# Patient Record
Sex: Female | Born: 1935 | Race: Black or African American | Hispanic: No | Smoking: Never smoker
Health system: Southern US, Community
[De-identification: ages and names within clinical notes are randomized; demographics above are authoritative.]

## PROBLEM LIST (undated history)

## (undated) DIAGNOSIS — E669 Obesity, unspecified: Secondary | ICD-10-CM

## (undated) DIAGNOSIS — M48061 Spinal stenosis, lumbar region without neurogenic claudication: Secondary | ICD-10-CM

## (undated) DIAGNOSIS — E119 Type 2 diabetes mellitus without complications: Secondary | ICD-10-CM

## (undated) DIAGNOSIS — I10 Essential (primary) hypertension: Secondary | ICD-10-CM

## (undated) DIAGNOSIS — E7800 Pure hypercholesterolemia, unspecified: Secondary | ICD-10-CM

## (undated) DIAGNOSIS — M79606 Pain in leg, unspecified: Secondary | ICD-10-CM

## (undated) HISTORY — DX: Pain in leg, unspecified: M79.606

## (undated) HISTORY — DX: Pure hypercholesterolemia, unspecified: E78.00

## (undated) HISTORY — DX: Obesity, unspecified: E66.9

## (undated) HISTORY — DX: Essential (primary) hypertension: I10

## (undated) HISTORY — DX: Type 2 diabetes mellitus without complications: E11.9

## (undated) HISTORY — PX: COLONOSCOPY: SHX174

## (undated) HISTORY — DX: Spinal stenosis, lumbar region without neurogenic claudication: M48.061

---

## 1998-08-04 ENCOUNTER — Ambulatory Visit (HOSPITAL_COMMUNITY): Admission: RE | Admit: 1998-08-04 | Discharge: 1998-08-04 | Payer: Self-pay | Admitting: Internal Medicine

## 1998-08-04 ENCOUNTER — Encounter: Payer: Self-pay | Admitting: Internal Medicine

## 1998-09-03 ENCOUNTER — Ambulatory Visit (HOSPITAL_COMMUNITY): Admission: RE | Admit: 1998-09-03 | Discharge: 1998-09-03 | Payer: Self-pay | Admitting: Ophthalmology

## 1998-09-03 ENCOUNTER — Encounter: Payer: Self-pay | Admitting: Ophthalmology

## 1998-09-21 ENCOUNTER — Other Ambulatory Visit: Admission: RE | Admit: 1998-09-21 | Discharge: 1998-09-21 | Payer: Self-pay | Admitting: Internal Medicine

## 1999-08-24 ENCOUNTER — Encounter: Payer: Self-pay | Admitting: Internal Medicine

## 1999-08-24 ENCOUNTER — Ambulatory Visit (HOSPITAL_COMMUNITY): Admission: RE | Admit: 1999-08-24 | Discharge: 1999-08-24 | Payer: Self-pay | Admitting: Internal Medicine

## 2000-09-12 ENCOUNTER — Encounter: Payer: Self-pay | Admitting: Internal Medicine

## 2000-09-12 ENCOUNTER — Ambulatory Visit (HOSPITAL_COMMUNITY): Admission: RE | Admit: 2000-09-12 | Discharge: 2000-09-12 | Payer: Self-pay | Admitting: Internal Medicine

## 2001-09-18 ENCOUNTER — Encounter: Payer: Self-pay | Admitting: Internal Medicine

## 2001-09-18 ENCOUNTER — Ambulatory Visit (HOSPITAL_COMMUNITY): Admission: RE | Admit: 2001-09-18 | Discharge: 2001-09-18 | Payer: Self-pay | Admitting: Internal Medicine

## 2002-02-04 ENCOUNTER — Encounter: Payer: Self-pay | Admitting: Internal Medicine

## 2002-02-04 ENCOUNTER — Encounter: Admission: RE | Admit: 2002-02-04 | Discharge: 2002-02-04 | Payer: Self-pay | Admitting: Internal Medicine

## 2002-09-20 ENCOUNTER — Ambulatory Visit (HOSPITAL_COMMUNITY): Admission: RE | Admit: 2002-09-20 | Discharge: 2002-09-20 | Payer: Self-pay | Admitting: Internal Medicine

## 2002-09-20 ENCOUNTER — Encounter: Payer: Self-pay | Admitting: Internal Medicine

## 2002-10-06 ENCOUNTER — Emergency Department (HOSPITAL_COMMUNITY): Admission: EM | Admit: 2002-10-06 | Discharge: 2002-10-06 | Payer: Self-pay | Admitting: Emergency Medicine

## 2003-10-29 ENCOUNTER — Ambulatory Visit (HOSPITAL_COMMUNITY): Admission: RE | Admit: 2003-10-29 | Discharge: 2003-10-29 | Payer: Self-pay | Admitting: Internal Medicine

## 2004-03-11 ENCOUNTER — Other Ambulatory Visit: Admission: RE | Admit: 2004-03-11 | Discharge: 2004-03-11 | Payer: Self-pay | Admitting: Family Medicine

## 2004-03-17 ENCOUNTER — Encounter: Admission: RE | Admit: 2004-03-17 | Discharge: 2004-03-17 | Payer: Self-pay | Admitting: Internal Medicine

## 2004-08-04 ENCOUNTER — Encounter: Admission: RE | Admit: 2004-08-04 | Discharge: 2004-08-04 | Payer: Self-pay | Admitting: Internal Medicine

## 2004-10-18 ENCOUNTER — Ambulatory Visit: Payer: Self-pay | Admitting: Internal Medicine

## 2004-11-01 ENCOUNTER — Ambulatory Visit: Payer: Self-pay | Admitting: Internal Medicine

## 2005-09-21 ENCOUNTER — Emergency Department (HOSPITAL_COMMUNITY): Admission: EM | Admit: 2005-09-21 | Discharge: 2005-09-21 | Payer: Self-pay | Admitting: Emergency Medicine

## 2007-01-12 ENCOUNTER — Ambulatory Visit: Admission: RE | Admit: 2007-01-12 | Discharge: 2007-01-12 | Payer: Self-pay | Admitting: Internal Medicine

## 2007-01-17 ENCOUNTER — Ambulatory Visit: Payer: Self-pay | Admitting: Pulmonary Disease

## 2009-02-26 ENCOUNTER — Encounter: Admission: RE | Admit: 2009-02-26 | Discharge: 2009-02-26 | Payer: Self-pay | Admitting: Internal Medicine

## 2009-10-07 ENCOUNTER — Encounter (INDEPENDENT_AMBULATORY_CARE_PROVIDER_SITE_OTHER): Payer: Self-pay | Admitting: *Deleted

## 2010-05-13 ENCOUNTER — Encounter: Admission: RE | Admit: 2010-05-13 | Discharge: 2010-05-13 | Payer: Self-pay | Admitting: Internal Medicine

## 2010-07-29 NOTE — Letter (Signed)
Summary: Colonoscopy Date Change Letter  [facility]  [address] [place], [place] [address]   Phone: [phone]  Fax: [phone]      October 07, 2009 MRN: [id number]   Brandi Stokes [address] [place], [place]  [address]   Dear [name],   Previously you were recommended to have a repeat colonoscopy around this time. Your chart was recently reviewed by Dr. Hedwig Morton. Juanda Chance of [facility]. Follow up colonoscopy is now recommended in May 2013. This revised recommendation is based on current, nationally recognized guidelines for colorectal cancer screening and polyp surveillance. These guidelines are endorsed by the American Cancer Society, [employer] on Colorectal Cancer as well as numerous other major medical organizations.  Please understand that our recommendation assumes that you do not have any new symptoms such as bleeding, a change in bowel habits, anemia, or significant abdominal discomfort. If you do have any concerning GI symptoms or want to discuss the guideline recommendations, please call to arrange an office visit at your earliest convenience. Otherwise we will keep you in our reminder system and contact you 1-2 months prior to the date listed above to schedule your next colonoscopy.  Thank you,  Hedwig Morton. Juanda Chance, M.D.  [facility] [phone]

## 2010-10-08 ENCOUNTER — Encounter: Payer: Self-pay | Admitting: Internal Medicine

## 2010-11-09 NOTE — Procedures (Signed)
Brandi Stokes, Brandi Stokes              ACCOUNT NO.:  [id number]   MEDICAL RECORD NO.:  [id number]          PATIENT TYPE:  OUT   LOCATION:  SLEEP LAB                     FACILITY:  [facility]   PHYSICIAN:  Barbaraann Share, MD,FCCPDATE OF BIRTH:  10-22-33   DATE OF STUDY:  01/12/2007                            NOCTURNAL POLYSOMNOGRAM   REFERRING PHYSICIAN:  Minerva Areola L. August Saucer, M.D.   INDICATIONS:  Hypersomnia with sleep apnea.   EPWORTH SLEEPINESS SCORE:  11.   MEDICATIONS:   SLEEP ARCHITECTURE:  The patient had A total sleep time of 355 minutes  with very little slow wave sleep or REM.  Sleep onset latency was mildly  prolonged at 34 minutes and REM onset was very prolonged at 154 minutes.  Sleep efficiency was decreased at 78%.   RESPIRATORY DATA:  The patient was found to have 38 hypopneas and 8  obstructive apneas for an apnea/hypopnea index of 8 events per hour.  These events were much more severe during REM and there was loud snoring  noted throughout.  The patient did not meet split night criteria  secondary to the majority of her events occurring after 1:00 a.m.   OXYGEN DATA:  The patient had O2 desaturation as low as 69% with her  events during REM.   CARDIAC DATA:  There was no clinically significant cardiac arrhythmias  noted.   MOVEMENT-PARASOMNIA:  The patient was found to have 24 leg jerks with  1.2 per hour resulting in arousal or awakening.  This is clinically  insignificant.   IMPRESSION/RECOMMENDATIONS:  Mild obstructive sleep apnea/hypopnea  syndrome with an apnea/hypopnea index of 8 events per hour and O2  desaturation as low as 69%.  The events were clearly worse during REM.  The patient did not meet split night criteria secondary to most of her  events occurring after 1:00 a.m.  Treatment for this degree of sleep  apnea can include weight loss alone if applicable, upper airway surgery,  oral  appliance, and also CPAP.  Decisions regarding treatment for this mild  degree of sleep apnea should primarily be based on its impact on the  patient's quality of life.      Barbaraann Share, MD,FCCP  Diplomate, American Board of Sleep  Medicine  Electronically Signed     [facility]/MEDQ  D:  01/17/2007 18:04:47  T:  01/18/2007 09:32:04  Job:  [id number]

## 2010-11-26 ENCOUNTER — Emergency Department (HOSPITAL_COMMUNITY)
Admission: EM | Admit: 2010-11-26 | Discharge: 2010-11-27 | Disposition: A | Discharge status: Home / Self Care | Payer: Medicare Other | Attending: Emergency Medicine | Admitting: Emergency Medicine

## 2010-11-26 ENCOUNTER — Emergency Department (HOSPITAL_COMMUNITY): Payer: Medicare Other

## 2010-11-26 DIAGNOSIS — M5430 Sciatica, unspecified side: Secondary | ICD-10-CM | POA: Insufficient documentation

## 2010-11-26 DIAGNOSIS — Z79899 Other long term (current) drug therapy: Secondary | ICD-10-CM | POA: Insufficient documentation

## 2010-11-26 DIAGNOSIS — E119 Type 2 diabetes mellitus without complications: Secondary | ICD-10-CM | POA: Insufficient documentation

## 2010-11-26 DIAGNOSIS — E785 Hyperlipidemia, unspecified: Secondary | ICD-10-CM | POA: Insufficient documentation

## 2010-11-26 DIAGNOSIS — I10 Essential (primary) hypertension: Secondary | ICD-10-CM | POA: Insufficient documentation

## 2010-12-14 ENCOUNTER — Ambulatory Visit (AMBULATORY_SURGERY_CENTER): Payer: Medicare Other

## 2010-12-14 VITALS — Ht 64.0 in | Wt 214.3 lb

## 2010-12-14 DIAGNOSIS — Z86010 Personal history of colonic polyps: Secondary | ICD-10-CM

## 2010-12-14 MED ORDER — PEG-KCL-NACL-NASULF-NA ASC-C 100 G PO SOLR: 1.0000 | Freq: Once | ORAL | Status: AC

## 2010-12-28 ENCOUNTER — Encounter: Payer: Self-pay | Admitting: Internal Medicine

## 2010-12-28 ENCOUNTER — Ambulatory Visit (AMBULATORY_SURGERY_CENTER): Payer: Medicare Other | Admitting: Internal Medicine

## 2010-12-28 VITALS — BP 120/70 | HR 61 | Temp 98.5°F | Resp 17 | Ht 64.0 in | Wt 214.0 lb

## 2010-12-28 DIAGNOSIS — Z86010 Personal history of colonic polyps: Secondary | ICD-10-CM

## 2010-12-28 DIAGNOSIS — D126 Benign neoplasm of colon, unspecified: Secondary | ICD-10-CM

## 2010-12-28 DIAGNOSIS — Z1211 Encounter for screening for malignant neoplasm of colon: Secondary | ICD-10-CM

## 2010-12-28 DIAGNOSIS — K5730 Diverticulosis of large intestine without perforation or abscess without bleeding: Secondary | ICD-10-CM

## 2010-12-28 LAB — GLUCOSE, CAPILLARY: Glucose-Capillary: 130 mg/dL — ABNORMAL HIGH (ref 70–99)

## 2010-12-28 MED ORDER — SODIUM CHLORIDE 0.9 % IV SOLN: 500.0000 mL | INTRAVENOUS | Status: AC

## 2010-12-28 NOTE — Patient Instructions (Signed)
Follow discharge instructions.  Continue your medications.  Await pathology results. 

## 2010-12-28 NOTE — Progress Notes (Signed)
IV attempted to Right forearm with #24- blood easily returned but IV site immediately puffs up. Attempted to Left hand with #24- same as above.  Burnice Logan RN

## 2010-12-30 ENCOUNTER — Telehealth: Payer: Self-pay | Admitting: *Deleted

## 2010-12-30 NOTE — Telephone Encounter (Signed)
No answer or identifier on answering machine. No message left for patient.

## 2011-01-06 ENCOUNTER — Encounter: Payer: Self-pay | Admitting: Internal Medicine

## 2011-06-15 ENCOUNTER — Other Ambulatory Visit: Payer: Self-pay | Admitting: Internal Medicine

## 2011-06-15 DIAGNOSIS — Z1231 Encounter for screening mammogram for malignant neoplasm of breast: Secondary | ICD-10-CM

## 2011-07-12 ENCOUNTER — Ambulatory Visit
Admission: RE | Admit: 2011-07-12 | Discharge: 2011-07-12 | Disposition: A | Discharge status: Home / Self Care | Payer: Medicare Other | Source: Ambulatory Visit | Attending: Internal Medicine | Admitting: Internal Medicine

## 2011-07-12 DIAGNOSIS — Z1231 Encounter for screening mammogram for malignant neoplasm of breast: Secondary | ICD-10-CM

## 2011-07-12 LAB — HM MAMMOGRAPHY

## 2012-07-17 ENCOUNTER — Encounter: Payer: Self-pay | Admitting: Hematology

## 2013-06-03 ENCOUNTER — Other Ambulatory Visit: Payer: Self-pay | Admitting: Internal Medicine

## 2013-06-03 DIAGNOSIS — Z1231 Encounter for screening mammogram for malignant neoplasm of breast: Secondary | ICD-10-CM

## 2013-07-09 ENCOUNTER — Ambulatory Visit
Admission: RE | Admit: 2013-07-09 | Discharge: 2013-07-09 | Disposition: A | Discharge status: Home / Self Care | Payer: Medicare Other | Source: Ambulatory Visit | Attending: Internal Medicine | Admitting: Internal Medicine

## 2013-07-09 DIAGNOSIS — Z1231 Encounter for screening mammogram for malignant neoplasm of breast: Secondary | ICD-10-CM

## 2015-06-06 ENCOUNTER — Emergency Department (HOSPITAL_COMMUNITY)
Admission: EM | Admit: 2015-06-06 | Discharge: 2015-06-06 | Disposition: A | Discharge status: Home / Self Care | Payer: Medicare Other | Attending: Emergency Medicine | Admitting: Emergency Medicine

## 2015-06-06 ENCOUNTER — Encounter (HOSPITAL_COMMUNITY): Payer: Self-pay

## 2015-06-06 DIAGNOSIS — D849 Immunodeficiency, unspecified: Secondary | ICD-10-CM | POA: Diagnosis not present

## 2015-06-06 DIAGNOSIS — T464X5A Adverse effect of angiotensin-converting-enzyme inhibitors, initial encounter: Secondary | ICD-10-CM | POA: Diagnosis present

## 2015-06-06 DIAGNOSIS — Z79899 Other long term (current) drug therapy: Secondary | ICD-10-CM | POA: Insufficient documentation

## 2015-06-06 DIAGNOSIS — E669 Obesity, unspecified: Secondary | ICD-10-CM | POA: Insufficient documentation

## 2015-06-06 DIAGNOSIS — E7800 Pure hypercholesterolemia, unspecified: Secondary | ICD-10-CM | POA: Insufficient documentation

## 2015-06-06 DIAGNOSIS — I10 Essential (primary) hypertension: Secondary | ICD-10-CM | POA: Diagnosis not present

## 2015-06-06 DIAGNOSIS — T783XXA Angioneurotic edema, initial encounter: Secondary | ICD-10-CM | POA: Diagnosis not present

## 2015-06-06 DIAGNOSIS — E119 Type 2 diabetes mellitus without complications: Secondary | ICD-10-CM | POA: Diagnosis not present

## 2015-06-06 DIAGNOSIS — Z7982 Long term (current) use of aspirin: Secondary | ICD-10-CM | POA: Insufficient documentation

## 2015-06-06 DIAGNOSIS — Z8739 Personal history of other diseases of the musculoskeletal system and connective tissue: Secondary | ICD-10-CM | POA: Insufficient documentation

## 2015-06-06 NOTE — ED Notes (Signed)
Pt presents with c/o allergic reaction. Pt reports she is unsure of what she is having a reaction to. Pt has some swelling around her mouth. Pt reports some itching on her arms as well. Pt is not having any difficulty breathing at this time.

## 2015-06-06 NOTE — Discharge Instructions (Signed)
Angioedema This stopped taking lisinopril-HCTZ. We feel that this is the cause of your lip swelling. Call Dr. Randel Pigg office in 2 days to schedule the next available appointment. We feel that you may need to be on a different blood pressure medication. Today's blood pressure was mildly elevated at 159/69. Return if you feel that swelling is worsening, he developed difficulty breathing, speaking or feel worse for any reason. Angioedema is sudden puffiness (swelling), often of the skin. It can happen:  On your face or privates (genitals).  In your belly (abdomen) or other body parts. It usually happens quickly and gets better in 1 or 2 days. It often starts at night and is found when you wake up. You may get red, itchy patches of skin (hives). Attacks can be dangerous if your breathing passages get puffy. The condition may happen only once, or it can come back at random times. It may happen for several years before it goes away for good. HOME CARE  Only take medicines as told by your doctor.  Always carry your emergency allergy medicines with you.  Wear a medical bracelet as told by your doctor.  Avoid things that you know will cause attacks (triggers). GET HELP IF:  You have another attack.  Your attacks happen more often or get worse.  The condition was passed to you by your parents and you want to have children. GET HELP RIGHT AWAY IF:   Your mouth, tongue, or lips are very puffy.  You have trouble breathing.  You have trouble swallowing.  You pass out (faint). MAKE SURE YOU:   Understand these instructions.  Will watch your condition.  Will get help right away if you are not doing well or get worse.   This information is not intended to replace advice given to you by your health care provider. Make sure you discuss any questions you have with your health care provider.   Document Released: 06/01/2009 Document Revised: 04/03/2013 Document Reviewed: 02/04/2013 Elsevier  Interactive Patient Education [employer].

## 2015-06-06 NOTE — ED Provider Notes (Signed)
CSN: [id number]     Arrival date & time 06/06/15  1257 History   First MD Initiated Contact with Patient 06/06/15 1321     Chief Complaint  Patient presents with  . Allergic Reaction     (Consider location/radiation/quality/duration/timing/severity/associated sxs/prior Treatment) HPI Patient noted swelling of her upper lip at 10:30 AM today. No other complaint. Denies difficulty breathing or speaking. No change in voice. She recently started lisinopril within the past week after having stopped it for several months. No treatment prior to coming here. No other associated symptoms. She denies any itching. Nothing makes symptoms better or worse. Past Medical History  Diagnosis Date  . Type II or unspecified type diabetes mellitus without mention of complication, not stated as uncontrolled   . Pure hypercholesterolemia   . Essential hypertension, malignant   . Obesity, unspecified   . Spinal stenosis, lumbar region, without neurogenic claudication   . Leg pain     left   Past Surgical History  Procedure Laterality Date  . Colonoscopy     Family History  Problem Relation Age of Onset  . Heart disease Mother   . Heart disease Father   . Breast cancer Sister    Social History  Substance Use Topics  . Smoking status: Never Smoker   . Smokeless tobacco: Never Used  . Alcohol Use: No   OB History    No data available     Review of Systems  Constitutional: Negative.   HENT: Negative.   Respiratory: Negative.   Cardiovascular: Negative.   Gastrointestinal: Negative.   Musculoskeletal: Negative.   Skin: Negative.   Allergic/Immunologic: Positive for immunocompromised state.       Diabetic Lip swelling  Neurological: Negative.   Psychiatric/Behavioral: Negative.   All other systems reviewed and are negative.     Allergies  Review of patient's allergies indicates no known allergies.  Home Medications   Prior to Admission medications   Medication Sig Start Date End  Date Taking? Authorizing Provider  aspirin 81 MG tablet Take 81 mg by mouth daily.      Historical Provider, MD  atorvastatin (LIPITOR) 20 MG tablet  12/04/10   Historical Provider, MD  Cholecalciferol (VITAMIN D3) 1000 UNITS CAPS Take 2 capsules by mouth daily.      Historical Provider, MD  HYDROcodone-acetaminophen (VICODIN) 5-500 MG per tablet Take 1 tablet by mouth every 6 (six) hours as needed.      Historical Provider, MD  lisinopril-hydrochlorothiazide (PRINZIDE,ZESTORETIC) 20-25 MG per tablet Take 1 tablet by mouth daily.     Historical Provider, MD  metFORMIN (GLUCOPHAGE) 500 MG tablet Take 500 mg by mouth 2 (two) times daily with a meal.      Historical Provider, MD  Omega-3 Fatty Acids (FISH OIL PO) Take 1,000 mg by mouth daily.      Historical Provider, MD  oxyCODONE-acetaminophen (PERCOCET) 5-325 MG per tablet  11/27/10   Historical Provider, MD   BP 197/77 mmHg  Pulse 71  Temp(Src) 97.4 F (36.3 C) (Oral)  Resp 18  SpO2 97% Physical Exam  Constitutional: She appears well-developed and well-nourished.  HENT:  Head: Normocephalic and atraumatic.  Upper lip mildly swollen no tenderness. No swelling of tongue or uvula. No trismus. Voice is normal no hoarseness  Eyes: Conjunctivae are normal. Pupils are equal, round, and reactive to light.  Neck: Neck supple. No tracheal deviation present. No thyromegaly present.  Cardiovascular: Normal rate and regular rhythm.   No murmur heard. Pulmonary/Chest: Effort normal and  breath sounds normal.  Abdominal: Soft. Bowel sounds are normal. She exhibits no distension. There is no tenderness.  Musculoskeletal: Normal range of motion. She exhibits no edema or tenderness.  Neurological: She is alert. Coordination normal.  Skin: Skin is warm and dry. No rash noted.  Psychiatric: She has a normal mood and affect.  Nursing note and vitals reviewed.   ED Course  Procedures (including critical care time) Labs Review Labs Reviewed - No data to  display  Imaging Review No results found. I have personally reviewed and evaluated these images and lab results as part of my medical decision-making.   EKG Interpretation None      MDM  Clinically patient with angioedema of the upper lip. No signs of airway compromise. Suspect lisinopril as the culprit. Plan stop lisinopril. Follow-up with Dr. Marlou Sa next week to schedule appointment. I've advised her that she may need to be on a different antihypertensive Final diagnoses:  None   diagnoses angioedema      Orlie Dakin, MD 06/06/15 1414

## 2019-06-02 ENCOUNTER — Emergency Department (HOSPITAL_COMMUNITY): Payer: Medicare Other

## 2019-06-02 ENCOUNTER — Emergency Department (HOSPITAL_COMMUNITY)
Admission: EM | Admit: 2019-06-02 | Discharge: 2019-06-02 | Disposition: A | Discharge status: Home / Self Care | Payer: Medicare Other | Attending: Emergency Medicine | Admitting: Emergency Medicine

## 2019-06-02 ENCOUNTER — Other Ambulatory Visit: Payer: Self-pay

## 2019-06-02 ENCOUNTER — Encounter (HOSPITAL_COMMUNITY): Payer: Self-pay | Admitting: Emergency Medicine

## 2019-06-02 DIAGNOSIS — M436 Torticollis: Secondary | ICD-10-CM | POA: Diagnosis not present

## 2019-06-02 DIAGNOSIS — I10 Essential (primary) hypertension: Secondary | ICD-10-CM | POA: Insufficient documentation

## 2019-06-02 DIAGNOSIS — E119 Type 2 diabetes mellitus without complications: Secondary | ICD-10-CM | POA: Insufficient documentation

## 2019-06-02 DIAGNOSIS — R410 Disorientation, unspecified: Secondary | ICD-10-CM | POA: Diagnosis not present

## 2019-06-02 DIAGNOSIS — R519 Headache, unspecified: Secondary | ICD-10-CM | POA: Diagnosis present

## 2019-06-02 LAB — CBC WITH DIFFERENTIAL/PLATELET
Abs Immature Granulocytes: 0.02 10*3/uL (ref 0.00–0.07)
Basophils Absolute: 0 10*3/uL (ref 0.0–0.1)
Basophils Relative: 0 %
Eosinophils Absolute: 0.1 10*3/uL (ref 0.0–0.5)
Eosinophils Relative: 1 %
HCT: 40 % (ref 36.0–46.0)
Hemoglobin: 12.6 g/dL (ref 12.0–15.0)
Immature Granulocytes: 0 %
Lymphocytes Relative: 14 %
Lymphs Abs: 1.1 10*3/uL (ref 0.7–4.0)
MCH: 29.9 pg (ref 26.0–34.0)
MCHC: 31.5 g/dL (ref 30.0–36.0)
MCV: 94.8 fL (ref 80.0–100.0)
Monocytes Absolute: 0.6 10*3/uL (ref 0.1–1.0)
Monocytes Relative: 8 %
Neutro Abs: 5.8 10*3/uL (ref 1.7–7.7)
Neutrophils Relative %: 77 %
Platelets: 284 10*3/uL (ref 150–400)
RBC: 4.22 MIL/uL (ref 3.87–5.11)
RDW: 12.8 % (ref 11.5–15.5)
WBC: 7.7 10*3/uL (ref 4.0–10.5)
nRBC: 0 % (ref 0.0–0.2)

## 2019-06-02 LAB — COMPREHENSIVE METABOLIC PANEL
ALT: 13 U/L (ref 0–44)
AST: 13 U/L — ABNORMAL LOW (ref 15–41)
Albumin: 4.5 g/dL (ref 3.5–5.0)
Alkaline Phosphatase: 79 U/L (ref 38–126)
Anion gap: 12 (ref 5–15)
BUN: 16 mg/dL (ref 8–23)
CO2: 26 mmol/L (ref 22–32)
Calcium: 9.3 mg/dL (ref 8.9–10.3)
Chloride: 104 mmol/L (ref 98–111)
Creatinine, Ser: 1.02 mg/dL — ABNORMAL HIGH (ref 0.44–1.00)
GFR calc Af Amer: 58 mL/min — ABNORMAL LOW (ref >60)
GFR calc non Af Amer: 50 mL/min — ABNORMAL LOW (ref >60)
Glucose, Bld: 108 mg/dL — ABNORMAL HIGH (ref 70–99)
Potassium: 3.5 mmol/L (ref 3.5–5.1)
Sodium: 142 mmol/L (ref 135–145)
Total Bilirubin: 0.9 mg/dL (ref 0.3–1.2)
Total Protein: 7.9 g/dL (ref 6.5–8.1)

## 2019-06-02 LAB — URINALYSIS, ROUTINE W REFLEX MICROSCOPIC
Bilirubin Urine: NEGATIVE
Glucose, UA: NEGATIVE mg/dL
Ketones, ur: 20 mg/dL — AB
Leukocytes,Ua: NEGATIVE
Nitrite: NEGATIVE
Protein, ur: NEGATIVE mg/dL
Specific Gravity, Urine: 1.013 (ref 1.005–1.030)
pH: 6 (ref 5.0–8.0)

## 2019-06-02 MED ORDER — HYDROCODONE-ACETAMINOPHEN 5-325 MG PO TABS
1.0000 | ORAL_TABLET | Freq: Once | ORAL | Status: AC
  Administered 2019-06-02: 18:00:00 1 via ORAL
  Filled 2019-06-02: qty 1

## 2019-06-02 MED ORDER — AMLODIPINE BESYLATE 2.5 MG PO TABS: 2.5000 mg | ORAL_TABLET | Freq: Every day | ORAL | 1 refills | Status: AC

## 2019-06-02 MED ORDER — ONDANSETRON 4 MG PO TBDP
4.0000 mg | ORAL_TABLET | Freq: Once | ORAL | Status: AC
  Administered 2019-06-02: 18:00:00 4 mg via ORAL
  Filled 2019-06-02: qty 1

## 2019-06-02 MED ORDER — AMLODIPINE BESYLATE 5 MG PO TABS
2.5000 mg | ORAL_TABLET | Freq: Once | ORAL | Status: AC
  Administered 2019-06-02: 19:00:00 2.5 mg via ORAL
  Filled 2019-06-02: qty 1

## 2019-06-02 MED ORDER — HYDROCODONE-ACETAMINOPHEN 5-325 MG PO TABS: 1.0000 | ORAL_TABLET | Freq: Four times a day (QID) | ORAL | 0 refills | Status: AC | PRN

## 2019-06-02 NOTE — ED Provider Notes (Signed)
[facility] EMERGENCY DEPARTMENT Provider Note   CSN: [id number] Arrival date & time: 06/02/19  1440     History   Chief Complaint Chief Complaint  Patient presents with   Headache    HPI Brandi Stokes is a 85 y.o. female.     Patient lives with her granddaughter.  Patient brought in by granddaughter.  Granddaughter was concerned that she was more confused today.  Patient though to me was complaining of right-sided neck pain that kind of went into the right side of the face and and into the right shoulder and halfway down the right arm.  But not below the elbow.  Denied any numbness or weakness to the right hand.  Patient states has been present for 2 days.  Granddaughter also reported a cough.  We did not witness any cough patient denied any significant congestion there was no dizziness weakness facial drooping or slurred speech.  Patient told nursing that there was some intermittent visual change right eye but she denied that to me.     Past Medical History:  Diagnosis Date   Essential hypertension, malignant    Leg pain    left   Obesity, unspecified    Pure hypercholesterolemia    Spinal stenosis, lumbar region, without neurogenic claudication    Type II or unspecified type diabetes mellitus without mention of complication, not stated as uncontrolled     There are no active problems to display for this patient.   Past Surgical History:  Procedure Laterality Date   COLONOSCOPY       OB History   No obstetric history on file.      Home Medications    Prior to Admission medications   Medication Sig Start Date End Date Taking? Authorizing Provider  amLODipine (NORVASC) 2.5 MG tablet Take 1 tablet (2.5 mg total) by mouth daily. 06/02/19   Fredia Sorrow, MD  HYDROcodone-acetaminophen (NORCO/VICODIN) 5-325 MG tablet Take 1 tablet by mouth every 6 (six) hours as needed. 06/02/19   Fredia Sorrow, MD    Family History Family History  Problem  Relation Age of Onset   Heart disease Mother    Heart disease Father    Breast cancer Sister     Social History Social History   Tobacco Use   Smoking status: Never Smoker   Smokeless tobacco: Never Used  Substance Use Topics   Alcohol use: No   Drug use: No     Allergies   Patient has no known allergies.   Review of Systems Review of Systems  Constitutional: Negative for chills and fever.  HENT: Negative for congestion, rhinorrhea and sore throat.   Eyes: Negative for visual disturbance.  Respiratory: Negative for cough and shortness of breath.   Cardiovascular: Negative for chest pain and leg swelling.  Gastrointestinal: Negative for abdominal pain, diarrhea, nausea and vomiting.  Genitourinary: Negative for dysuria.  Musculoskeletal: Positive for neck pain. Negative for back pain.  Skin: Negative for rash.  Neurological: Positive for headaches. Negative for dizziness and light-headedness.  Hematological: Does not bruise/bleed easily.  Psychiatric/Behavioral: Positive for confusion.     Physical Exam Updated Vital Signs BP (!) 187/87    Pulse 87    Temp 98.5 F (36.9 C) (Oral)    Resp (!) 25    Ht 1.588 m (5' 2.5")    Wt 97.1 kg    SpO2 92%    BMI 38.53 kg/m   Physical Exam Vitals signs and nursing note reviewed.  Constitutional:  General: She is not in acute distress.    Appearance: Normal appearance. She is well-developed.  HENT:     Head: Normocephalic and atraumatic.  Eyes:     Extraocular Movements: Extraocular movements intact.     Conjunctiva/sclera: Conjunctivae normal.     Pupils: Pupils are equal, round, and reactive to light.  Neck:     Musculoskeletal: Normal range of motion and neck supple.     Comments: No posterior neck tenderness.  Some palpable tenderness to the right sternocleidomastoid muscle.  Some discomfort with movement of the head and neck left and right.  No meningeal signs. Cardiovascular:     Rate and Rhythm: Normal  rate and regular rhythm.     Heart sounds: No murmur.  Pulmonary:     Effort: Pulmonary effort is normal. No respiratory distress.     Breath sounds: Normal breath sounds.  Abdominal:     Palpations: Abdomen is soft.     Tenderness: There is no abdominal tenderness.  Musculoskeletal: Normal range of motion.  Skin:    General: Skin is warm and dry.     Capillary Refill: Capillary refill takes less than 2 seconds.  Neurological:     General: No focal deficit present.     Mental Status: She is alert and oriented to person, place, and time.     Cranial Nerves: No cranial nerve deficit.     Sensory: No sensory deficit.     Motor: No weakness.      ED Treatments / Results  Labs (all labs ordered are listed, but only abnormal results are displayed) Labs Reviewed  URINALYSIS, ROUTINE W REFLEX MICROSCOPIC - Abnormal; Notable for the following components:      Result Value   Hgb urine dipstick SMALL (*)    Ketones, ur 20 (*)    Bacteria, UA RARE (*)    All other components within normal limits  COMPREHENSIVE METABOLIC PANEL - Abnormal; Notable for the following components:   Glucose, Bld 108 (*)    Creatinine, Ser 1.02 (*)    AST 13 (*)    GFR calc non Af Amer 50 (*)    GFR calc Af Amer 58 (*)    All other components within normal limits  CBC WITH DIFFERENTIAL/PLATELET    EKG EKG Interpretation  Date/Time:  Sunday June 02 2019 15:49:33 EST Ventricular Rate:  76 PR Interval:    QRS Duration: 151 QT Interval:  423 QTC Calculation: 476 R Axis:   50 Text Interpretation: Sinus rhythm Ventricular premature complex Left bundle branch block Confirmed by Fredia Sorrow [phone]) on 06/02/2019 4:04:45 PM   Radiology Ct Head Wo Contrast  Result Date: 06/02/2019 CLINICAL DATA:  Headache and pain that radiates into the right arm. EXAM: CT HEAD WITHOUT CONTRAST CT CERVICAL SPINE WITHOUT CONTRAST TECHNIQUE: Multidetector CT imaging of the head and cervical spine was performed  following the standard protocol without intravenous contrast. Multiplanar CT image reconstructions of the cervical spine were also generated. COMPARISON:  None. FINDINGS: CT HEAD FINDINGS Brain: No evidence of acute infarction, hemorrhage, hydrocephalus, extra-axial collection or mass lesion/mass effect. Periventricular white matter hypoattenuation likely represents chronic small vessel ischemic disease. Vascular: There are vascular calcifications in the carotid siphons. Skull: Normal. Negative for fracture or focal lesion. Sinuses/Orbits: No acute finding. Other: None. CT CERVICAL SPINE FINDINGS Alignment: Normal. Skull base and vertebrae: No acute fracture. No primary bone lesion or focal pathologic process. Soft tissues and spinal canal: No prevertebral fluid or swelling. No visible  canal hematoma. Disc levels: Multilevel degenerative disc and joint disease resulting in varying degrees of neuroforaminal and central canal stenosis. Upper chest: Negative. Other: None IMPRESSION: 1. No acute intracranial process. 2. No acute osseous injury in the cervical spine. Electronically Signed   By: Zerita Boers M.D.   On: 06/02/2019 17:37   Ct Cervical Spine Wo Contrast  Result Date: 06/02/2019 CLINICAL DATA:  Headache and pain that radiates into the right arm. EXAM: CT HEAD WITHOUT CONTRAST CT CERVICAL SPINE WITHOUT CONTRAST TECHNIQUE: Multidetector CT imaging of the head and cervical spine was performed following the standard protocol without intravenous contrast. Multiplanar CT image reconstructions of the cervical spine were also generated. COMPARISON:  None. FINDINGS: CT HEAD FINDINGS Brain: No evidence of acute infarction, hemorrhage, hydrocephalus, extra-axial collection or mass lesion/mass effect. Periventricular white matter hypoattenuation likely represents chronic small vessel ischemic disease. Vascular: There are vascular calcifications in the carotid siphons. Skull: Normal. Negative for fracture or focal  lesion. Sinuses/Orbits: No acute finding. Other: None. CT CERVICAL SPINE FINDINGS Alignment: Normal. Skull base and vertebrae: No acute fracture. No primary bone lesion or focal pathologic process. Soft tissues and spinal canal: No prevertebral fluid or swelling. No visible canal hematoma. Disc levels: Multilevel degenerative disc and joint disease resulting in varying degrees of neuroforaminal and central canal stenosis. Upper chest: Negative. Other: None IMPRESSION: 1. No acute intracranial process. 2. No acute osseous injury in the cervical spine. Electronically Signed   By: Zerita Boers M.D.   On: 06/02/2019 17:37   Dg Chest Port 1 View  Result Date: 06/02/2019 CLINICAL DATA:  85 year old female with cough. EXAM: PORTABLE CHEST 1 VIEW COMPARISON:  Chest radiograph dated 03/17/2004. FINDINGS: There is mild cardiomegaly with mild vascular congestion. No focal consolidation, pleural effusion, or pneumothorax. Minimal bibasilar atelectasis. No acute osseous pathology. IMPRESSION: Mild cardiomegaly with mild vascular congestion. No focal consolidation. Electronically Signed   By: Anner Crete M.D.   On: 06/02/2019 16:22    Procedures Procedures (including critical care time)  Medications Ordered in ED Medications  HYDROcodone-acetaminophen (NORCO/VICODIN) 5-325 MG per tablet 1 tablet (1 tablet Oral Given 06/02/19 1820)  ondansetron (ZOFRAN-ODT) disintegrating tablet 4 mg (4 mg Oral Given 06/02/19 1820)  amLODipine (NORVASC) tablet 2.5 mg (2.5 mg Oral Given 06/02/19 1839)     Initial Impression / Assessment and Plan / ED Course  I have reviewed the triage vital signs and the nursing notes.  Pertinent labs & imaging results that were available during my care of the patient were reviewed by me and considered in my medical decision making (see chart for details).        Patient's symptoms seem to have the counter related to like a neck torticollis.  Also could be a little bit of radicular  pain on the right side of the neck because it radiates down to the midportion of the arm.  But there is no radiculopathy or symptoms in the hand.  Patient also stated the pain radiated up towards the side of the face on the right side.  Did talk about some discomfort there.  Did not really have a headache on top or in the back.  But CT head and neck without any acute findings.  Patient was persistently hypertensive.  Saw that patient was on lisinopril in the past but in 2016 apparently had angioedema.  Patient states she has not been taking any blood pressure medicine but she cannot member how long.  She is followed by Kevan Ny primary  care doctor in [place].  Patient treated here with some pain medicine after labs were negative and the CTs were negative blood pressure came down to about [id number] systolic patient treated with Norvasc at 2.5 mg will be continued on that have her follow back up with her primary care doctor.  Labs without any significant abnormalities no leukocytosis chest x-ray also negative for pneumonia no evidence of urinary tract infection.  No significant electrolyte abnormalities.  Also clinically I do not feel that there is evidence of a stroke.  Patient feeling better with the pain medicine but still having some difficulty with moving the neck left to right.  There is no meningeal type signs.  And patient is not toxic.  Final Clinical Impressions(s) / ED Diagnoses   Final diagnoses:  Essential hypertension  Torticollis, acute    ED Discharge Orders         Ordered    HYDROcodone-acetaminophen (NORCO/VICODIN) 5-325 MG tablet  Every 6 hours PRN     06/02/19 1950    amLODipine (NORVASC) 2.5 MG tablet  Daily     06/02/19 1950           Fredia Sorrow, MD 06/02/19 2014

## 2019-06-02 NOTE — ED Triage Notes (Signed)
Patient c/o headache and pain that radiates into right arm. Patient started x2 days ago become more severe this morning at 1:30am. Denies any dizziness, weakness, facial drooping, or slurred speech. Patient does report intermittent vision change in right eye. Patient states hasn't felt her normal self in a couple days.

## 2019-06-02 NOTE — Discharge Instructions (Addendum)
Hydrocodone provided for the neck pain and shoulder pain.  Take as directed.  Make an appointment follow-up with your regular doctor to follow-up with this.  Also blood pressure high here start the Norvasc for high blood pressure started on a low dose.  Will need to have your blood pressure rechecked by your primary care doctor in a week or 2.  Return for any new or worse symptoms.  Today we did CT head and neck.  The neck pain seems to be muscular but could be radicular in nature.  CT head and neck without any acute findings.  Labs without any significant abnormalities and urinalysis was normal.

## 2019-06-02 NOTE — ED Triage Notes (Signed)
Per granddaughter patient has had some confusion for past 2 days. Granddaughter also states patient has had new onset of cough.

## 2020-04-03 ENCOUNTER — Other Ambulatory Visit: Payer: Self-pay

## 2020-04-03 ENCOUNTER — Ambulatory Visit: Payer: Medicare PPO | Admitting: Podiatry

## 2020-04-03 ENCOUNTER — Encounter: Payer: Self-pay | Admitting: Podiatry

## 2020-04-03 DIAGNOSIS — B351 Tinea unguium: Secondary | ICD-10-CM

## 2020-04-03 DIAGNOSIS — M79675 Pain in left toe(s): Secondary | ICD-10-CM

## 2020-04-03 DIAGNOSIS — M79674 Pain in right toe(s): Secondary | ICD-10-CM | POA: Diagnosis not present

## 2020-04-03 NOTE — Progress Notes (Signed)
  Subjective:  Patient ID: Brandi Stokes, female    DOB: May 21, 1932,  MRN: [id number]  Chief Complaint  Patient presents with  . routine foot exam    nail trim    86 y.o. female returns for the above complaint.  Patient presents with thickened elongated dystrophic toenails x10.  They are mildly painful to touch.  She would like to have them debrided down.  She has not seen anyone else prior to seeing me.  She is not a diabetic however she is a prediabetic.  She denies any other acute complaints.  Objective:  There were no vitals filed for this visit. Podiatric Exam: Vascular: dorsalis pedis and posterior tibial pulses are palpable bilateral. Capillary return is immediate. Temperature gradient is WNL. Skin turgor WNL  Sensorium: Normal Semmes Weinstein monofilament test. Normal tactile sensation bilaterally. Nail Exam: Pt has thick disfigured discolored nails with subungual debris noted bilateral entire nail hallux through fifth toenails.  Pain on palpation to the nails. Ulcer Exam: There is no evidence of ulcer or pre-ulcerative changes or infection. Orthopedic Exam: Muscle tone and strength are WNL. No limitations in general ROM. No crepitus or effusions noted. HAV  B/L.  Hammer toes 2-5  B/L. Skin: No Porokeratosis. No infection or ulcers    Assessment & Plan:   1. Pain due to onychomycosis of toenails of both feet     Patient was evaluated and treated and all questions answered.  Onychomycosis with pain  -Nails palliatively debrided as below. -Educated on self-care  Procedure: Nail Debridement Rationale: pain  Type of Debridement: manual, sharp debridement. Instrumentation: Nail nipper, rotary burr. Number of Nails: 10  Procedures and Treatment: Consent by patient was obtained for treatment procedures. The patient understood the discussion of treatment and procedures well. All questions were answered thoroughly reviewed. Debridement of mycotic and hypertrophic toenails, 1  through 5 bilateral and clearing of subungual debris. No ulceration, no infection noted.  Return Visit-Office Procedure: Patient instructed to return to the office for a follow up visit 3 months for continued evaluation and treatment.  Boneta Lucks, DPM    No follow-ups on file.

## 2020-07-08 ENCOUNTER — Ambulatory Visit: Payer: Medicare PPO | Admitting: Podiatry

## 2020-07-08 ENCOUNTER — Other Ambulatory Visit: Payer: Self-pay

## 2020-07-08 DIAGNOSIS — M79675 Pain in left toe(s): Secondary | ICD-10-CM | POA: Diagnosis not present

## 2020-07-08 DIAGNOSIS — M79674 Pain in right toe(s): Secondary | ICD-10-CM

## 2020-07-08 DIAGNOSIS — B351 Tinea unguium: Secondary | ICD-10-CM

## 2020-07-14 ENCOUNTER — Encounter: Payer: Self-pay | Admitting: Podiatry

## 2020-07-14 NOTE — Progress Notes (Signed)
  Subjective:  Patient ID: Brandi Stokes, female    DOB: 17-Jan-1932,  MRN: [id number]  Chief Complaint  Patient presents with  . Nail Problem    Left foot hallux nail needs trimmed    87 y.o. female returns for the above complaint.  Patient presents with thickened elongated dystrophic toenails x10.  They are mildly painful to touch.  She would like to have them debrided down.  She has not seen anyone else prior to seeing me.  She is not a diabetic however she is a prediabetic.  She denies any other acute complaints.  Objective:  There were no vitals filed for this visit. Podiatric Exam: Vascular: dorsalis pedis and posterior tibial pulses are palpable bilateral. Capillary return is immediate. Temperature gradient is WNL. Skin turgor WNL  Sensorium: Normal Semmes Weinstein monofilament test. Normal tactile sensation bilaterally. Nail Exam: Pt has thick disfigured discolored nails with subungual debris noted bilateral entire nail hallux through fifth toenails.  Pain on palpation to the nails. Ulcer Exam: There is no evidence of ulcer or pre-ulcerative changes or infection. Orthopedic Exam: Muscle tone and strength are WNL. No limitations in general ROM. No crepitus or effusions noted. HAV  B/L.  Hammer toes 2-5  B/L. Skin: No Porokeratosis. No infection or ulcers    Assessment & Plan:   1. Pain due to onychomycosis of toenails of both feet     Patient was evaluated and treated and all questions answered.  Onychomycosis with pain  -Nails palliatively debrided as below. -Educated on self-care  Procedure: Nail Debridement Rationale: pain  Type of Debridement: manual, sharp debridement. Instrumentation: Nail nipper, rotary burr. Number of Nails: 10  Procedures and Treatment: Consent by patient was obtained for treatment procedures. The patient understood the discussion of treatment and procedures well. All questions were answered thoroughly reviewed. Debridement of mycotic and  hypertrophic toenails, 1 through 5 bilateral and clearing of subungual debris. No ulceration, no infection noted.  Return Visit-Office Procedure: Patient instructed to return to the office for a follow up visit 3 months for continued evaluation and treatment.  Boneta Lucks, DPM    No follow-ups on file.

## 2020-10-16 ENCOUNTER — Encounter: Payer: Self-pay | Admitting: Podiatry

## 2020-10-16 ENCOUNTER — Ambulatory Visit: Payer: Medicare PPO | Admitting: Podiatry

## 2020-10-16 ENCOUNTER — Other Ambulatory Visit: Payer: Self-pay

## 2020-10-16 DIAGNOSIS — M79675 Pain in left toe(s): Secondary | ICD-10-CM | POA: Diagnosis not present

## 2020-10-16 DIAGNOSIS — B351 Tinea unguium: Secondary | ICD-10-CM

## 2020-10-16 DIAGNOSIS — M79674 Pain in right toe(s): Secondary | ICD-10-CM

## 2020-10-16 NOTE — Progress Notes (Signed)
  Subjective:  Patient ID: Brandi Stokes, female    DOB: [birth year],  MRN: [id number]  Chief Complaint  Patient presents with  . Nail Problem    Nail trim    87 y.o. female returns for the above complaint.  Patient presents with thickened elongated dystrophic toenails x10.  They are mildly painful to touch.  She would like to have them debrided down.She is not a diabetic however she is a prediabetic.  She denies any other acute complaints.  Objective:  There were no vitals filed for this visit. Podiatric Exam: Vascular: dorsalis pedis and posterior tibial pulses are palpable bilateral. Capillary return is immediate. Temperature gradient is WNL. Skin turgor WNL  Sensorium: Normal Semmes Weinstein monofilament test. Normal tactile sensation bilaterally. Nail Exam: Pt has thick disfigured discolored nails with subungual debris noted bilateral entire nail hallux through fifth toenails.  Pain on palpation to the nails. Ulcer Exam: There is no evidence of ulcer or pre-ulcerative changes or infection. Orthopedic Exam: Muscle tone and strength are WNL. No limitations in general ROM. No crepitus or effusions noted. HAV  B/L.  Hammer toes 2-5  B/L. Skin: No Porokeratosis. No infection or ulcers    Assessment & Plan:   1. Pain due to onychomycosis of toenails of both feet     Patient was evaluated and treated and all questions answered.  Onychomycosis with pain  -Nails palliatively debrided as below. -Educated on self-care  Procedure: Nail Debridement Rationale: pain  Type of Debridement: manual, sharp debridement. Instrumentation: Nail nipper, rotary burr. Number of Nails: 10  Procedures and Treatment: Consent by patient was obtained for treatment procedures. The patient understood the discussion of treatment and procedures well. All questions were answered thoroughly reviewed. Debridement of mycotic and hypertrophic toenails, 1 through 5 bilateral and clearing of subungual debris. No  ulceration, no infection noted.  Return Visit-Office Procedure: Patient instructed to return to the office for a follow up visit 3 months for continued evaluation and treatment.  Boneta Lucks, DPM    Return in about 3 months (around 01/15/2021) for [place] .

## 2021-01-06 ENCOUNTER — Encounter: Payer: Self-pay | Admitting: Gastroenterology

## 2021-01-22 ENCOUNTER — Ambulatory Visit: Payer: Medicare PPO | Admitting: Podiatry

## 2021-01-22 ENCOUNTER — Other Ambulatory Visit: Payer: Self-pay

## 2021-01-22 DIAGNOSIS — M79675 Pain in left toe(s): Secondary | ICD-10-CM | POA: Diagnosis not present

## 2021-01-22 DIAGNOSIS — B351 Tinea unguium: Secondary | ICD-10-CM

## 2021-01-22 DIAGNOSIS — M79674 Pain in right toe(s): Secondary | ICD-10-CM | POA: Diagnosis not present

## 2021-01-24 ENCOUNTER — Encounter: Payer: Self-pay | Admitting: Podiatry

## 2021-01-24 NOTE — Progress Notes (Signed)
Subjective: Brandi Stokes is a pleasant 87 y.o. female patient seen today painful thick toenails that are difficult to trim. Pain interferes with ambulation. Aggravating factors include wearing enclosed shoe gear. Pain is relieved with periodic professional debridement.  Patient's granddaughter is present during today's visit.  PCP is Rogers Blocker, MD. Last visit was: June, 2022.Marland Kitchen  No Known Allergies  Objective: Physical Exam  General: Spickard is a pleasant 87 y.o. African American female, WD, WN in NAD. AAO x 3.   Vascular:  Capillary refill time to digits immediate b/l. Palpable pedal pulses b/l LE. Pedal hair sparse. Lower extremity skin temperature gradient within normal limits. No pain with calf compression b/l. No edema noted b/l lower extremities.  Dermatological:  Pedal skin with normal turgor, texture and tone b/l lower extremities. No open wounds b/l lower extremities. No interdigital macerations b/l lower extremities. Toenails 1-5 b/l elongated, discolored, dystrophic, thickened, crumbly with subungual debris and tenderness to dorsal palpation.  Musculoskeletal:  Normal muscle strength 5/5 to all lower extremity muscle groups bilaterally. Hallux valgus with bunion deformity noted b/l lower extremities. Hammertoe(s) noted to the 2-5 bilaterally.  Neurological:  Protective sensation intact 5/5 intact bilaterally with 10g monofilament b/l.  Assessment and Plan:  1. Pain due to onychomycosis of toenails of both feet   -Examined patient. -Patient to continue soft, supportive shoe gear daily. -Toenails 1-5 b/l were debrided in length and girth with sterile nail nippers and dremel without iatrogenic bleeding.  -Patient to report any pedal injuries to medical professional immediately. -Patient/POA to call should there be question/concern in the interim.  Return in about 3 months (around 04/24/2021).  Marzetta Board, DPM

## 2021-04-30 ENCOUNTER — Other Ambulatory Visit: Payer: Self-pay

## 2021-04-30 ENCOUNTER — Encounter: Payer: Self-pay | Admitting: Podiatry

## 2021-04-30 ENCOUNTER — Ambulatory Visit: Payer: Medicare PPO | Admitting: Podiatry

## 2021-04-30 DIAGNOSIS — B351 Tinea unguium: Secondary | ICD-10-CM | POA: Diagnosis not present

## 2021-04-30 DIAGNOSIS — M79674 Pain in right toe(s): Secondary | ICD-10-CM | POA: Diagnosis not present

## 2021-04-30 DIAGNOSIS — M79675 Pain in left toe(s): Secondary | ICD-10-CM | POA: Diagnosis not present

## 2021-05-03 ENCOUNTER — Other Ambulatory Visit (HOSPITAL_COMMUNITY): Payer: Self-pay | Admitting: Internal Medicine

## 2021-05-03 DIAGNOSIS — N189 Chronic kidney disease, unspecified: Secondary | ICD-10-CM

## 2021-05-04 NOTE — Progress Notes (Signed)
  Subjective:  Patient ID: Brandi Stokes, female    DOB: [birth year],  MRN: [id number]  Brandi Stokes presents to clinic today for painful thick toenails that are difficult to trim. Pain interferes with ambulation. Aggravating factors include wearing enclosed shoe gear. Pain is relieved with periodic professional debridement.  Patient voices no new pedal problems on today's visit.  PCP is Rogers Blocker, MD , and last visit was September, 2022.  No Known Allergies  Review of Systems: Negative except as noted in the HPI. Objective:   Constitutional Brandi Stokes is a pleasant 87 y.o. African American female, in NAD. AAO x 3.   Vascular CFT immediate b/l LE. Palpable DP/PT pulses b/l LE. Digital hair sparse b/l. Skin temperature gradient WNL b/l. No pain with calf compression b/l. No edema noted b/l. Lower extremity skin temperature gradient within normal limits. No cyanosis or clubbing noted.  Neurologic Normal speech. Oriented to person, place, and time. Protective sensation intact 5/5 intact bilaterally with 10g monofilament b/l.  Dermatologic Pedal integument with normal turgor, texture and tone b/l LE. No open wounds b/l. No interdigital macerations b/l. Toenails 1-5 b/l elongated, thickened, discolored with subungual debris. +Tenderness with dorsal palpation of nailplates. No hyperkeratotic or porokeratotic lesions present. Toenails 1-5 b/l elongated, discolored, dystrophic, thickened, crumbly with subungual debris and tenderness to dorsal palpation.  Orthopedic: Normal muscle strength 5/5 to all lower extremity muscle groups bilaterally. HAV with bunion deformity noted b/l LE. Hammertoe deformity noted 2-5 b/l.   Radiographs: None Assessment:   1. Pain due to onychomycosis of toenails of both feet    Plan:  Patient was evaluated and treated and all questions answered. Consent given for treatment as described below: -No new findings. No new orders. -Mycotic toenails were  debrided in length and girth with sterile nail nippers and dremel without iatrogenic bleeding. -Patient/POA to call should there be question/concern in the interim.  Return in about 3 months (around 07/31/2021).  Marzetta Board, DPM

## 2021-05-11 ENCOUNTER — Ambulatory Visit (HOSPITAL_COMMUNITY)
Admission: RE | Admit: 2021-05-11 | Discharge: 2021-05-11 | Disposition: A | Discharge status: Home / Self Care | Payer: Medicare PPO | Source: Ambulatory Visit | Attending: Internal Medicine | Admitting: Internal Medicine

## 2021-05-11 ENCOUNTER — Other Ambulatory Visit: Payer: Self-pay

## 2021-05-11 DIAGNOSIS — N189 Chronic kidney disease, unspecified: Secondary | ICD-10-CM | POA: Diagnosis not present

## 2021-08-10 ENCOUNTER — Ambulatory Visit: Payer: Medicare PPO | Admitting: Podiatry

## 2021-08-13 ENCOUNTER — Other Ambulatory Visit: Payer: Self-pay

## 2021-08-13 ENCOUNTER — Encounter: Payer: Self-pay | Admitting: Podiatry

## 2021-08-13 ENCOUNTER — Ambulatory Visit: Payer: Medicare PPO | Admitting: Podiatry

## 2021-08-13 DIAGNOSIS — M79674 Pain in right toe(s): Secondary | ICD-10-CM | POA: Diagnosis not present

## 2021-08-13 DIAGNOSIS — M79675 Pain in left toe(s): Secondary | ICD-10-CM

## 2021-08-13 DIAGNOSIS — B351 Tinea unguium: Secondary | ICD-10-CM | POA: Diagnosis not present

## 2021-08-21 NOTE — Progress Notes (Signed)
Subjective: Brandi Stokes is a 88 y.o. female patient seen today for follow up of  painful elongated mycotic toenails 1-5 bilaterally which are tender when wearing enclosed shoe gear. Pain is relieved with periodic professional debridement..   New problem(s)/concern(s) today: None    PCP is Rogers Blocker, MD. Last visit was: January, 2023.  No Known Allergies  Objective: Physical Exam  General: Patient is a pleasant 88 y.o. African American female WD, WN in NAD. AAO x 3.   Neurovascular Examination: CFT <3 seconds b/l LE. Palpable pedal pulses b/l LE. No pain with calf compression b/l. Lower extremity skin temperature gradient within normal limits. No edema noted b/l LE. No cyanosis or clubbing noted b/l LE.  Protective sensation intact 5/5 intact bilaterally with 10g monofilament b/l. Vibratory sensation intact b/l.  Dermatological:  Pedal integument with normal turgor, texture and tone b/l LE. No open wounds b/l. No interdigital macerations b/l. Toenails 1-5 b/l elongated, thickened, discolored with subungual debris. +Tenderness with dorsal palpation of nailplates. No hyperkeratotic or porokeratotic lesions present.  Musculoskeletal:  Normal muscle strength 5/5 to all lower extremity muscle groups bilaterally. HAV with bunion deformity noted b/l LE. Hammertoe deformity noted 2-5 b/l.Marland Kitchen No pain, crepitus or joint limitation noted with ROM b/l LE.  Patient ambulates independently without assistive aids.  Assessment: 1. Pain due to onychomycosis of toenails of both feet    Plan: Patient was evaluated and treated and all questions answered. Consent given for treatment as described below: -Toenails 1-5 b/l were debrided in length and girth with sterile nail nippers and dremel without iatrogenic bleeding.  -Patient/POA to call should there be question/concern in the interim.  Return in about 3 months (around 11/10/2021).  Marzetta Board, DPM

## 2021-09-25 ENCOUNTER — Encounter (HOSPITAL_COMMUNITY): Payer: Self-pay | Admitting: *Deleted

## 2021-09-25 ENCOUNTER — Emergency Department (HOSPITAL_COMMUNITY)
Admission: EM | Admit: 2021-09-25 | Discharge: 2021-09-25 | Disposition: A | Discharge status: Home / Self Care | Payer: Medicare PPO | Attending: Emergency Medicine | Admitting: Emergency Medicine

## 2021-09-25 ENCOUNTER — Other Ambulatory Visit: Payer: Self-pay

## 2021-09-25 DIAGNOSIS — H5789 Other specified disorders of eye and adnexa: Secondary | ICD-10-CM | POA: Diagnosis present

## 2021-09-25 DIAGNOSIS — Z79899 Other long term (current) drug therapy: Secondary | ICD-10-CM | POA: Diagnosis not present

## 2021-09-25 DIAGNOSIS — E119 Type 2 diabetes mellitus without complications: Secondary | ICD-10-CM | POA: Insufficient documentation

## 2021-09-25 DIAGNOSIS — L03213 Periorbital cellulitis: Secondary | ICD-10-CM | POA: Insufficient documentation

## 2021-09-25 DIAGNOSIS — I10 Essential (primary) hypertension: Secondary | ICD-10-CM | POA: Insufficient documentation

## 2021-09-25 MED ORDER — CEPHALEXIN 500 MG PO CAPS: 500.0000 mg | ORAL_CAPSULE | Freq: Four times a day (QID) | ORAL | 0 refills | Status: DC

## 2021-09-25 MED ORDER — SULFAMETHOXAZOLE-TRIMETHOPRIM 800-160 MG PO TABS: 1.0000 | ORAL_TABLET | Freq: Two times a day (BID) | ORAL | 0 refills | Status: AC

## 2021-09-25 NOTE — ED Provider Notes (Signed)
?[facility] ?Provider Note ? ? ?CSN: [id number] ?Arrival date & time: 09/25/21  1153 ? ?  ? ?History ? ?Chief Complaint  ?Patient presents with  ? Eye Problem  ? ? ?Brandi Stokes is a 88 y.o. female. ? ?The history is provided by the patient.  ?Eye Problem ? ?Patient with history of type 2 diabetes, hypertension, hyperlipidemia presents today with bilateral eye swelling.  Patient is an inconsistent historian, her sisters are in the room providing additional information.  The patient tells me yesterday she woke up and noticed swelling in bilateral eyes, denies any pain.  States she did have some discharge from the eyes bilaterally.  States she also had an episode this morning with purulent eye drainage, her right eye appeared more swollen than the left.  She does not wear glasses or contacts at baseline, she tells me she has been having intermittent blurry vision over the last 2 days worse in the morning. ? ?Home Medications ?Prior to Admission medications   ?Medication Sig Start Date End Date Taking? Authorizing Provider  ?sulfamethoxazole-trimethoprim (BACTRIM DS) 800-160 MG tablet Take 1 tablet by mouth 2 (two) times daily for 7 days. 09/25/21 10/02/21 Yes Sherrill Raring, PA-C  ?amLODipine (NORVASC) 2.5 MG tablet Take 1 tablet (2.5 mg total) by mouth daily. ?Patient not taking: Reported on 04/03/2020 06/02/19   Fredia Sorrow, MD  ?chlorthalidone (HYGROTON) 25 MG tablet  03/13/20   [provider]  ?HYDROcodone-acetaminophen (NORCO/VICODIN) 5-325 MG tablet Take 1 tablet by mouth every 6 (six) hours as needed. ?Patient not taking: Reported on 04/03/2020 06/02/19   Fredia Sorrow, MD  ?losartan (COZAAR) 100 MG tablet  02/18/20   [provider]  ?memantine (NAMENDA) 5 MG tablet Take by mouth. 05/11/21   [provider]  ?Olmesartan-amLODIPine-HCTZ 40-5-12.5 MG TABS Take by mouth. 07/22/21   [provider]  ?rosuvastatin (CRESTOR) 5 MG tablet  03/13/20   [provider]  ?   ? ?Allergies    ?Patient has no known allergies.   ? ?Review of Systems   ?Review of Systems ? ?Physical Exam ?Updated Vital Signs ?BP (!) 140/58 (BP Location: Right Arm)   Pulse (!) 55   Temp 97.7 ?F (36.5 ?C) (Oral)   Resp 15   Ht '5\' 4"'$  (1.626 m)   Wt 70 kg   SpO2 100%   BMI 26.49 kg/m?  ?Physical Exam ?Vitals and nursing note reviewed. Exam conducted with a chaperone present.  ?Constitutional:   ?   General: She is not in acute distress. ?   Appearance: Normal appearance.  ?HENT:  ?   Head: Normocephalic and atraumatic.  ?Eyes:  ?   General: No scleral icterus. ?   Extraocular Movements: Extraocular movements intact.  ?   Pupils: Pupils are equal, round, and reactive to light.  ?   Comments: EOMI, PERRLA..  There is no pain with EOMs, no injected conjunctive.  There is some upper eyelid edema.  ?Skin: ?   Coloration: Skin is not jaundiced.  ?Neurological:  ?   Mental Status: She is alert. Mental status is at baseline.  ?   Coordination: Coordination normal.  ? ? ?ED Results / Procedures / Treatments   ?Labs ?(all labs ordered are listed, but only abnormal results are displayed) ?Labs Reviewed - No data to display ? ?EKG ?None ? ?Radiology ?No results found. ? ?Procedures ?Procedures  ? ? ?Medications Ordered in ED ?Medications - No data to display ? ?ED Course/ Medical  Decision Making/ A&P ?  ?                        ?Medical Decision Making ?Risk ?Prescription drug management. ? ? ?This is an 88 year old female presenting today with bilateral swelling.  Differential diagnosis includes but is not limited to conjunctivitis, orbital cellulitis, preseptal cellulitis, uveitis, keratitis. ? ?Patient sisters are at bedside and providing independent history. ? ?Physical exam reassuring, there are some upper eyelid edema but it does not extend surrounding the orbital fossa.  EOMI, PERRLA.  There is no pain with EOMs which is also reassuring.  No fever.  Visual acuity with bilateral  20/40. ? ?Does not appear consistent with an orbital cellulitis.  Additionally there is no purulent discharge injected conjunctive a that would be suggestive of a conjunctivitis presentation.  No gross visual loss, she is not complaining of any lateral eye symptoms either. ? ?We will treat patient as if she has a preseptal cellulitis given the upper eyelid edema and discharged with Bactrim twice daily.  Ophthalmology follow-up provided, strict return precautions discussed.  Patient was discharged in stable condition. ? ?Discussed HPI, physical exam and plan of care for this patient with attending Dr. Octaviano Glow. The attending physician evaluated this patient as part of a shared visit and agrees with plan of care. ? ? ? ? ? ? ? ? ?Final Clinical Impression(s) / ED Diagnoses ?Final diagnoses:  ?Preseptal cellulitis  ? ? ?Rx / DC Orders ?ED Discharge Orders   ? ?      Ordered  ?  cephALEXin (KEFLEX) 500 MG capsule  4 times daily,   Status:  Discontinued       ? 09/25/21 1253  ?  sulfamethoxazole-trimethoprim (BACTRIM DS) 800-160 MG tablet  2 times daily       ? 09/25/21 1255  ? ?  ?  ? ?  ? ? ?  ?Sherrill Raring, PA-C ?09/25/21 1259 ? ?  ?Wyvonnia Dusky, MD ?09/25/21 1603 ? ?

## 2021-09-25 NOTE — Discharge Instructions (Addendum)
Few coarse cold compresses with the frozen peas at home.  Take the bactrim twice daily for the next 7 days.  Follow-up with ophthalmology, information bided above.  Return to the ED if you lose vision, the swelling worsens, if you are having blurry vision or fevers. ?

## 2021-09-25 NOTE — ED Triage Notes (Addendum)
Pt with eye swelling to bilateral eyes since waking up this morning. Denies any drainage.  Denies any allergies. Denies any changes in vision.  ?

## 2021-10-04 IMAGING — CT CT HEAD W/O CM
3 series · 14 of 46 positions shown, 16 images · non-contrast
Comparison: None.

CLINICAL DATA: Headache and pain that radiates into the right arm.

EXAM:
CT HEAD WITHOUT CONTRAST
CT CERVICAL SPINE WITHOUT CONTRAST
TECHNIQUE: Multidetector CT imaging of the head and cervical spine was
performed following the standard protocol without intravenous
contrast. Multiplanar CT image reconstructions of the cervical spine
were also generated.

[Series 2: head w o · axial · 0.41mm/px · z∈[+104,+224]mm · 8 of 29 slices shown, 10 images]
[im 3/29  brain]
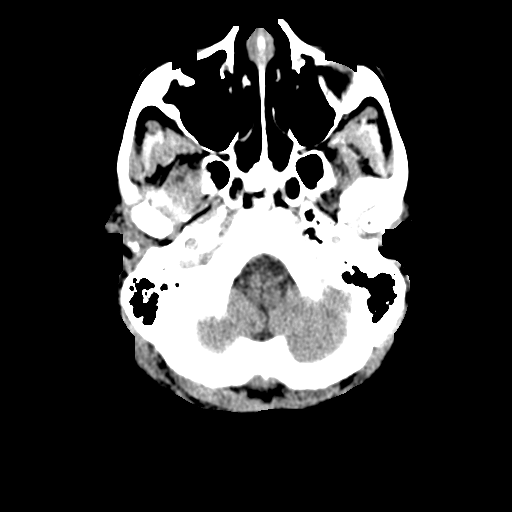
[im 3/29  bone]
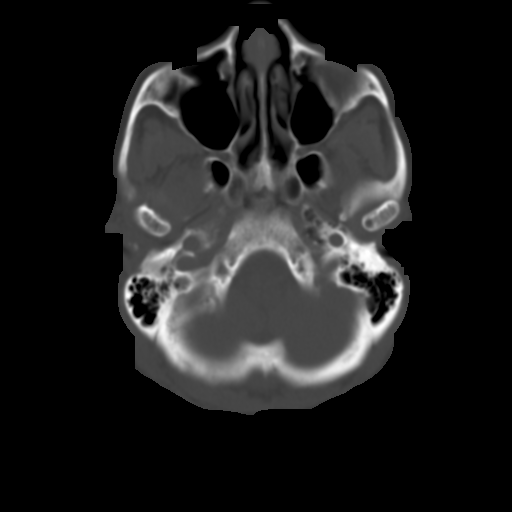
[im 7/29  brain]
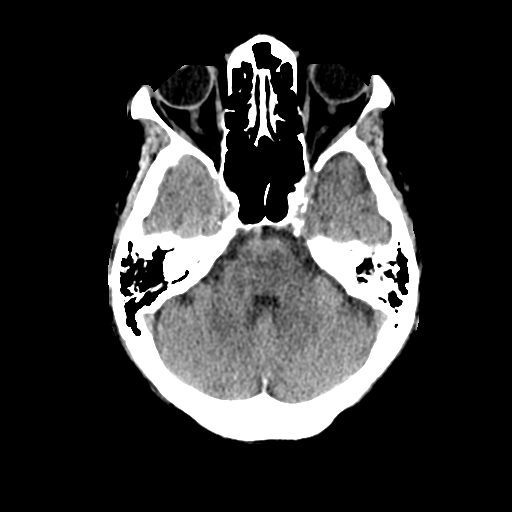
[im 10/29  brain]
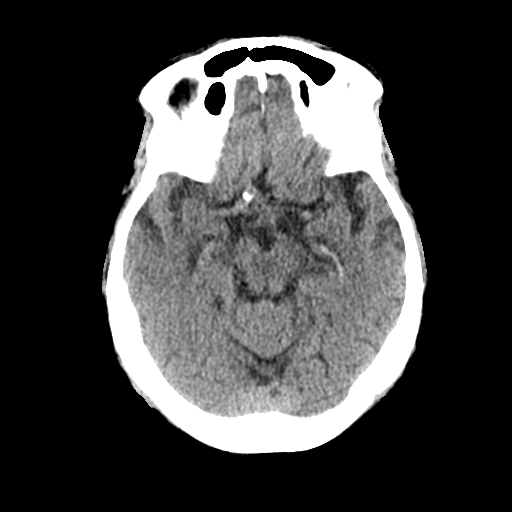
[im 13/29  brain]
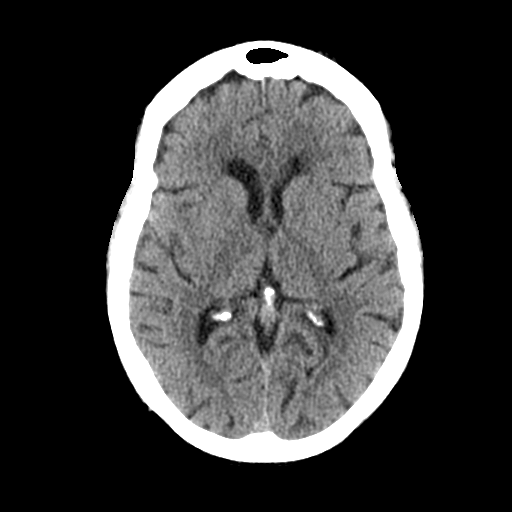
[im 17/29  brain]
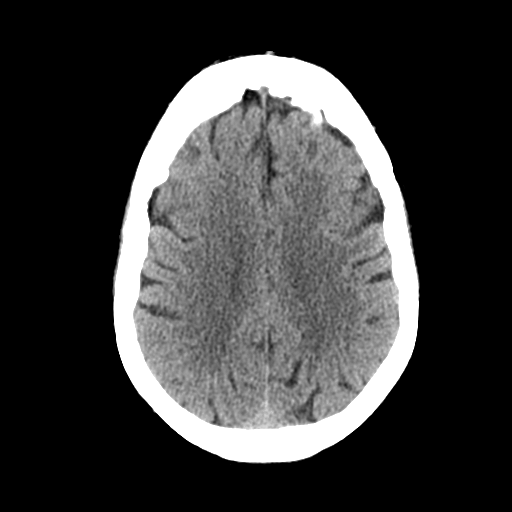
[im 17/29  bone]
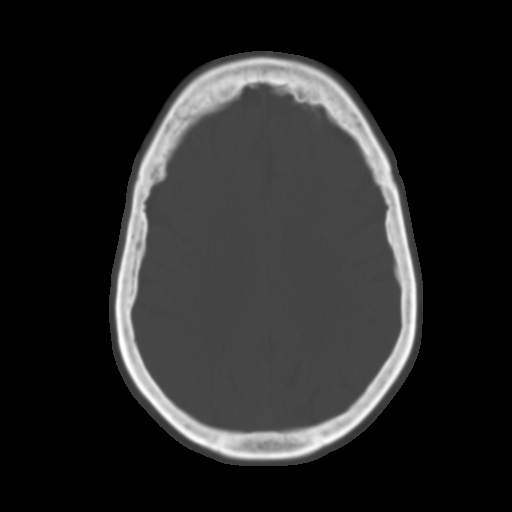
[im 20/29  brain]
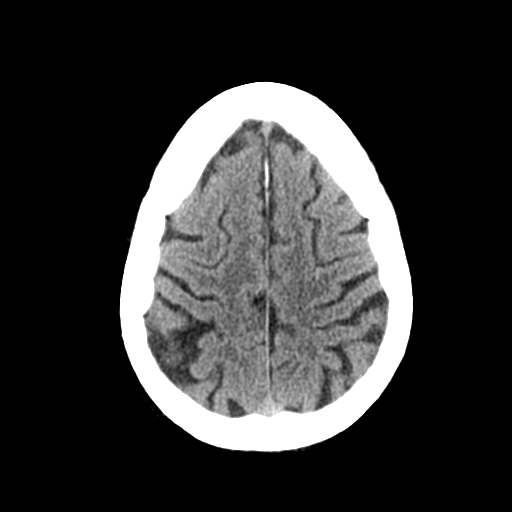
[im 23/29  brain]
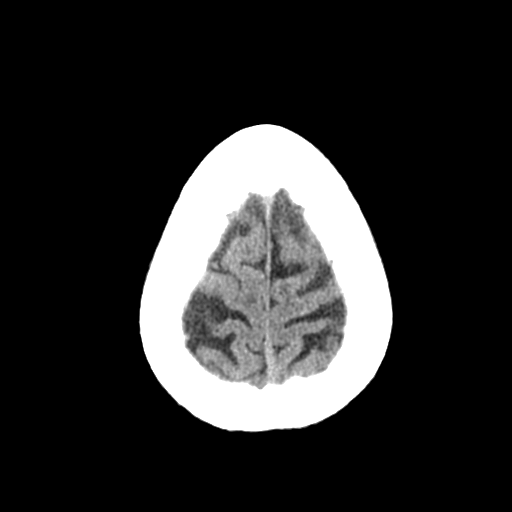
[im 27/29  brain]
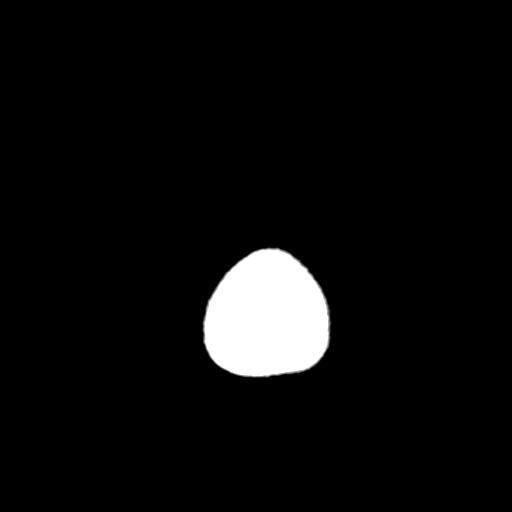

[Series 4: coronal soft · coronal · 0.28mm/px · 3 of 72 slices shown]
[im 24/72  brain]
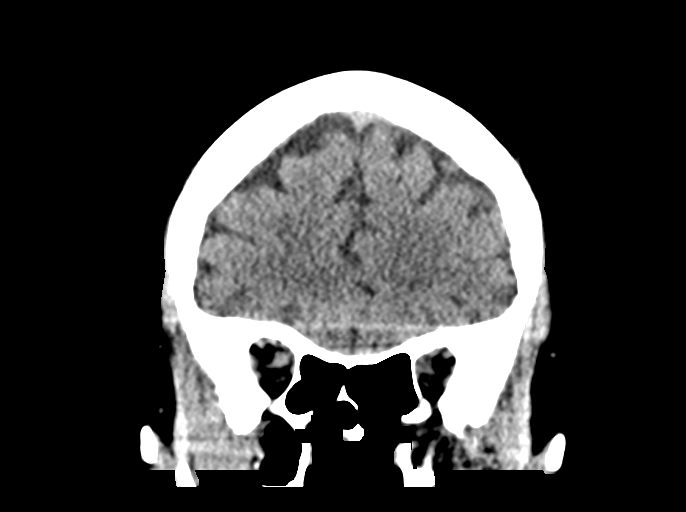
[im 32/72  brain]
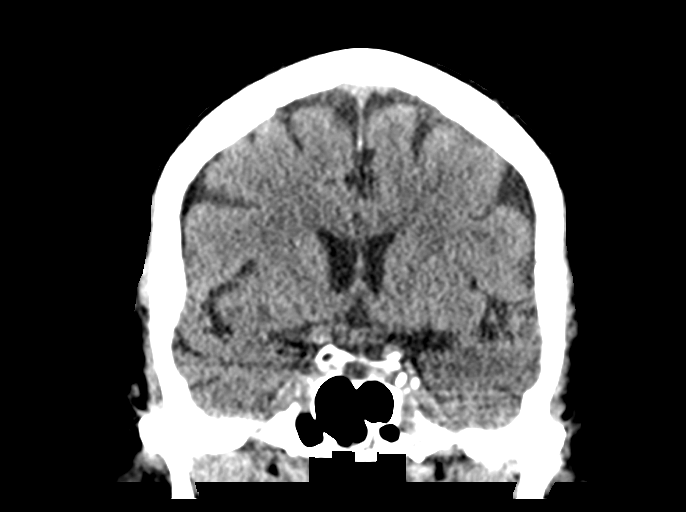
[im 40/72  brain]
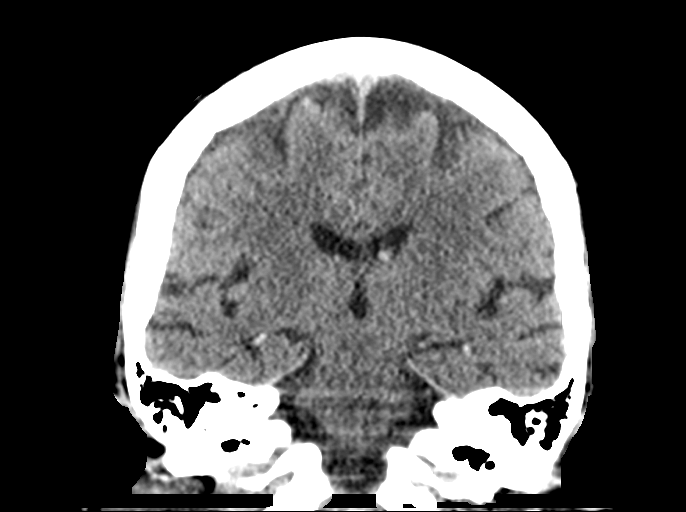

[Series 5: sagittal soft · sagittal · 0.28mm/px · 3 of 56 slices shown]
[im 19/56  brain]
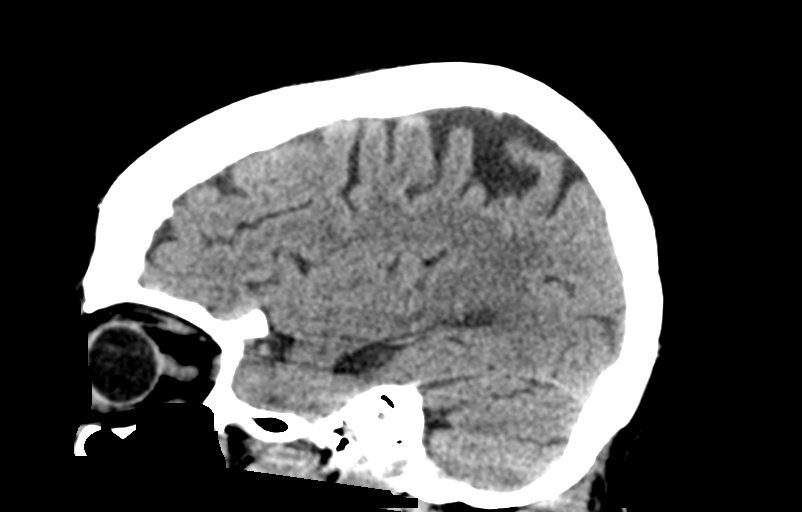
[im 28/56  brain]
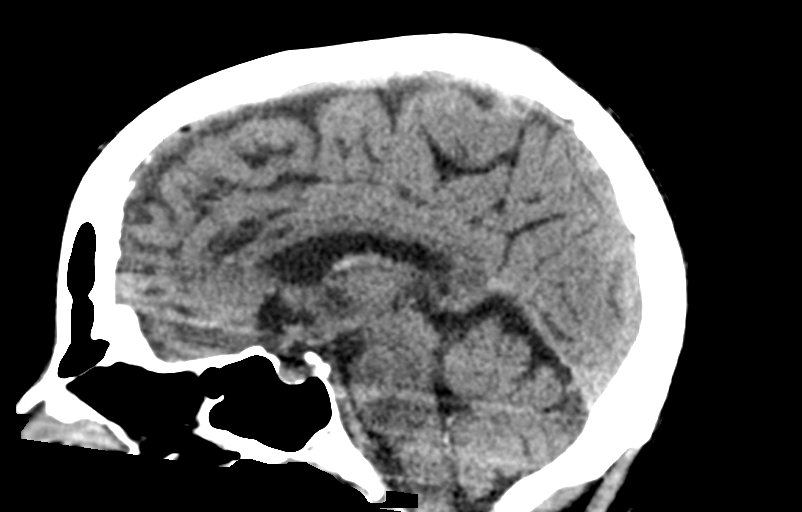
[im 37/56  brain]
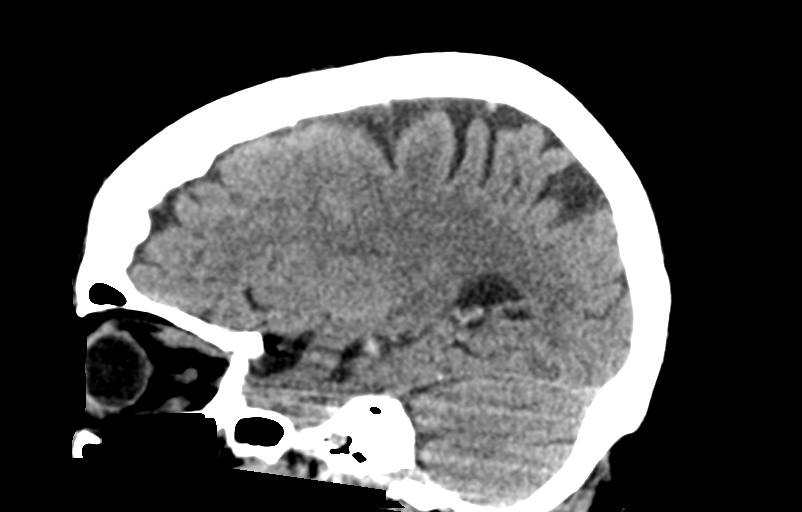

[14 of 46 positions shown; findings below may reference images not displayed]

FINDINGS: CT HEAD FINDINGS

Brain: No evidence of acute infarction, hemorrhage, hydrocephalus,
extra-axial collection or mass lesion/mass effect. Periventricular
white matter hypoattenuation likely represents chronic small vessel
ischemic disease.

Vascular: There are vascular calcifications in the carotid siphons.

Skull: Normal. Negative for fracture or focal lesion.

Sinuses/Orbits: No acute finding.

Other: None.

CT CERVICAL SPINE FINDINGS

Alignment: Normal.

Skull base and vertebrae: No acute fracture. No primary bone lesion
or focal pathologic process.

Soft tissues and spinal canal: No prevertebral fluid or swelling. No
visible canal hematoma.

Disc levels: Multilevel degenerative disc and joint disease
resulting in varying degrees of neuroforaminal and central canal
stenosis.

Upper chest: Negative.

Other: None
IMPRESSION: 1. No acute intracranial process.
2. No acute osseous injury in the cervical spine.

## 2021-11-10 ENCOUNTER — Encounter: Payer: Self-pay | Admitting: Podiatry

## 2021-11-10 ENCOUNTER — Ambulatory Visit: Payer: Medicare PPO | Admitting: Podiatry

## 2021-11-10 DIAGNOSIS — B351 Tinea unguium: Secondary | ICD-10-CM

## 2021-11-10 DIAGNOSIS — M79675 Pain in left toe(s): Secondary | ICD-10-CM | POA: Diagnosis not present

## 2021-11-10 DIAGNOSIS — M79674 Pain in right toe(s): Secondary | ICD-10-CM

## 2021-11-18 NOTE — Progress Notes (Signed)
  Subjective:  Patient ID: Brandi Stokes, female    DOB: Jun 24, 1932,  MRN: [id number]  Brandi Stokes presents to clinic today for painful thick toenails that are difficult to trim. Pain interferes with ambulation. Aggravating factors include wearing enclosed shoe gear. Pain is relieved with periodic professional debridement.  Patient is accompanied by her daughter on today's visit.  New problem(s): None.   PCP is Rogers Blocker, MD , and last visit was Nov 07, 2021.  No Known Allergies  Review of Systems: Negative except as noted in the HPI.  Objective: No changes noted in today's physical examination. General: Patient is a pleasant 88 y.o. African American female WD, WN in NAD. AAO x 3.   Neurovascular Examination: CFT <3 seconds b/l LE. Palpable pedal pulses b/l LE. No pain with calf compression b/l. Lower extremity skin temperature gradient within normal limits. No edema noted b/l LE. No cyanosis or clubbing noted b/l LE.  Protective sensation intact 5/5 intact bilaterally with 10g monofilament b/l. Vibratory sensation intact b/l.  Dermatological:  Pedal integument with normal turgor, texture and tone b/l LE. No open wounds b/l. No interdigital macerations b/l. Toenails 1-5 b/l elongated, thickened, discolored with subungual debris. +Tenderness with dorsal palpation of nailplates. No hyperkeratotic or porokeratotic lesions present.  Musculoskeletal:  Normal muscle strength 5/5 to all lower extremity muscle groups bilaterally. HAV with bunion deformity noted b/l LE. Hammertoe deformity noted 2-5 b/l. No pain, crepitus or joint limitation noted with ROM b/l LE.  Patient ambulates independently without assistive aids.  Assessment/Plan: 1. Pain due to onychomycosis of toenails of both feet   -Patient was evaluated and treated. All patient's and/or POA's questions/concerns answered on today's visit. -Patient to continue soft, supportive shoe gear daily. -Mycotic toenails 1-5  bilaterally were debrided in length and girth with sterile nail nippers and dremel without incident. -Patient/POA to call should there be question/concern in the interim.   Return in about 3 months (around 02/10/2022).  Marzetta Board, DPM

## 2022-02-16 ENCOUNTER — Ambulatory Visit: Payer: Medicare PPO | Admitting: Podiatry

## 2022-08-15 ENCOUNTER — Encounter: Payer: Self-pay | Admitting: Podiatry

## 2022-08-15 ENCOUNTER — Ambulatory Visit: Payer: Medicare PPO | Admitting: Podiatry

## 2022-08-15 DIAGNOSIS — M79674 Pain in right toe(s): Secondary | ICD-10-CM

## 2022-08-15 DIAGNOSIS — M79675 Pain in left toe(s): Secondary | ICD-10-CM | POA: Diagnosis not present

## 2022-08-15 DIAGNOSIS — B351 Tinea unguium: Secondary | ICD-10-CM

## 2022-08-15 NOTE — Progress Notes (Signed)
  Subjective:  Patient ID: Brandi Stokes, female    DOB: [birth year],   MRN: [id number]  Chief Complaint  Patient presents with   Nail Problem     Routine foot care    89 y.o. female presents for concern of thickened elongated and painful nails that are difficult to trim. Requesting to have them trimmed today. Relates burning and tingling in their feet. Patient is not diabetic.   PCP:  Rogers Blocker, MD    . Denies any other pedal complaints. Denies n/v/f/c.   Past Medical History:  Diagnosis Date   Essential hypertension, malignant    Leg pain    left   Obesity, unspecified    Pure hypercholesterolemia    Spinal stenosis, lumbar region, without neurogenic claudication    Type II or unspecified type diabetes mellitus without mention of complication, not stated as uncontrolled     Objective:  Physical Exam: Vascular: DP/PT pulses 2/4 bilateral. CFT <3 seconds. Absent hair growth on digits. Edema noted to bilateral lower extremities. Xerosis noted bilaterally.  Skin. No lacerations or abrasions bilateral feet. Nails 1-5 bilateral  are thickened discolored and elongated with subungual debri s.  Musculoskeletal: MMT 5/5 bilateral lower extremities in DF, PF, Inversion and Eversion. Deceased ROM in DF of ankle joint.  Neurological: Sensation intact to light touch. Protective sensation intact bilateral.     Assessment:   1. Pain due to onychomycosis of toenails of both feet      Plan:  Patient was evaluated and treated and all questions answered. -Discussed and educated patient on foot care, especially with  regards to the vascular, neurological and musculoskeletal systems.  -Discussed supportive shoes at all times and checking feet regularly.  -Mechanically debrided all nails 1-5 bilateral using sterile nail nipper and filed with dremel without incident  -Answered all patient questions -Patient to return  in 3 months for at risk foot care -Patient advised to call the office  if any problems or questions arise in the meantime.   Lorenda Peck, DPM

## 2022-11-29 ENCOUNTER — Encounter: Payer: Self-pay | Admitting: Podiatry

## 2022-11-29 ENCOUNTER — Ambulatory Visit: Payer: Medicare PPO | Admitting: Podiatry

## 2022-11-29 VITALS — BP 135/54

## 2022-11-29 DIAGNOSIS — B351 Tinea unguium: Secondary | ICD-10-CM | POA: Diagnosis not present

## 2022-11-29 DIAGNOSIS — M79675 Pain in left toe(s): Secondary | ICD-10-CM | POA: Diagnosis not present

## 2022-11-29 DIAGNOSIS — M79674 Pain in right toe(s): Secondary | ICD-10-CM

## 2022-12-04 NOTE — Progress Notes (Signed)
  Subjective:  Patient ID: Brandi Stokes, female    DOB: 06/04/1932,  MRN: [id number]  Brandi Stokes presents to clinic today for painful thick toenails that are difficult to trim. Pain interferes with ambulation. Aggravating factors include wearing enclosed shoe gear. Pain is relieved with periodic professional debridement. Chief Complaint  Patient presents with   Nail Problem    DFC, Gwenyth Bender, MD,LOV:2 weeks ago     New problem(s): None.   PCP is Gwenyth Bender, MD.  No Known Allergies  Review of Systems: Negative except as noted in the HPI. Objective:   Constitutional Brandi Stokes is a pleasant 89 y.o. female, in NAD. AAO x 3.   Vascular Vascular Examination: Capillary refill time I<3 seconds b/l. Vascular status intact b/l with palpable pedal pulses. Pedal hair diminished b/l. Trace edema. No pain with calf compression b/l. Skin temperature gradient WNL b/l. No cyanosis or clubbing b/l.   Neurological Examination: Sensation grossly intact b/l with 10 gram monofilament. Vibratory sensation intact b/l.   Dermatological Examination: Pedal skin with normal turgor, texture and tone b/l.  No open wounds. No interdigital macerations.   Toenails 1-5 b/l thick, discolored, elongated with subungual debris and pain on dorsal palpation.   No hyperkeratotic nor porokeratotic lesions present on today's visit.  Musculoskeletal Examination: Muscle strength 5/5 to all lower extremity muscle groups bilaterally. Limited joint ROM to the bilateral ankles.  Radiographs: None    Assessment:   1. Pain due to onychomycosis of toenails of both feet    Plan:  Patient was evaluated and treated and all questions answered. Consent given for treatment as described below: -Patient was evaluated and treated. All patient's and/or POA's questions/concerns answered on today's visit. -Patient to continue soft, supportive shoe gear daily. -Toenails 1-5 b/l were debrided in length and girth  with sterile nail nippers and dremel without iatrogenic bleeding.  -Patient/POA to call should there be question/concern in the interim.  No follow-ups on file.  Freddie Breech, DPM

## 2023-03-13 ENCOUNTER — Ambulatory Visit (INDEPENDENT_AMBULATORY_CARE_PROVIDER_SITE_OTHER): Payer: Medicare PPO | Admitting: Podiatry

## 2023-03-13 DIAGNOSIS — Z91199 Patient's noncompliance with other medical treatment and regimen due to unspecified reason: Secondary | ICD-10-CM

## 2023-03-13 NOTE — Progress Notes (Signed)
1. No-show for appointment     

## 2023-09-13 IMAGING — US US RENAL
1 series · 14 of 25 positions shown · non-contrast
Comparison: None

CLINICAL DATA: Chronic progressive renal failure

EXAM:
RENAL / URINARY TRACT ULTRASOUND COMPLETE

[Series 1: us renal · 0.23mm/px · 14 of 54 slices shown]
[im 1/54]
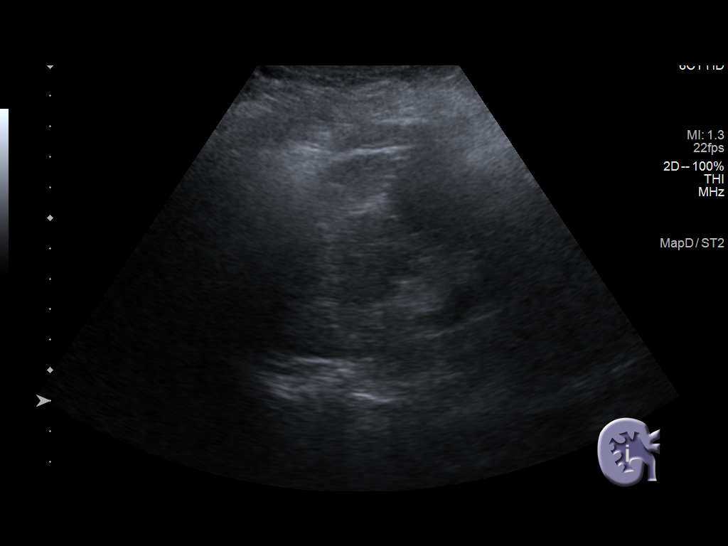
[im 5/54]
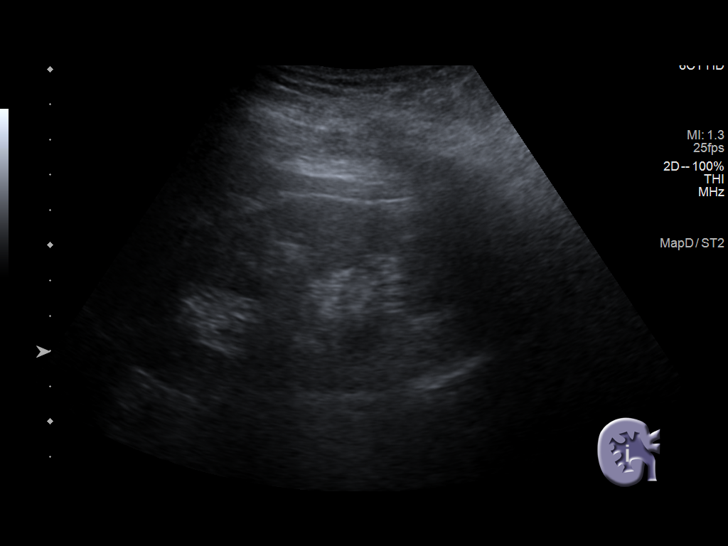
[im 9/54]
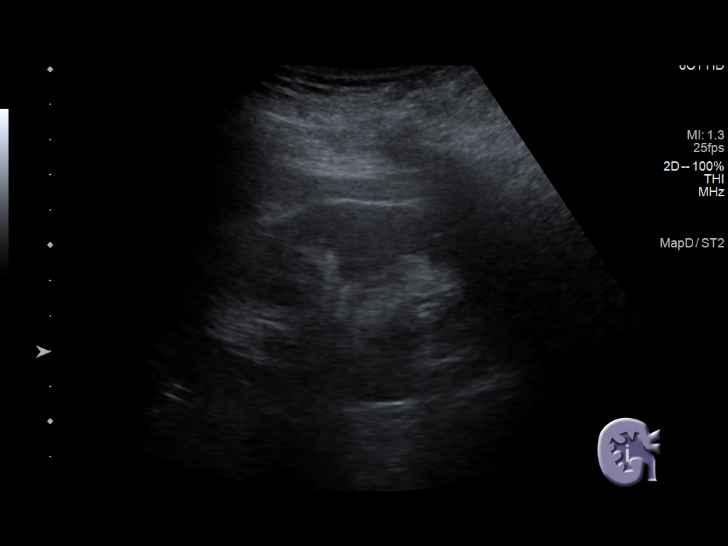
[im 14/54]
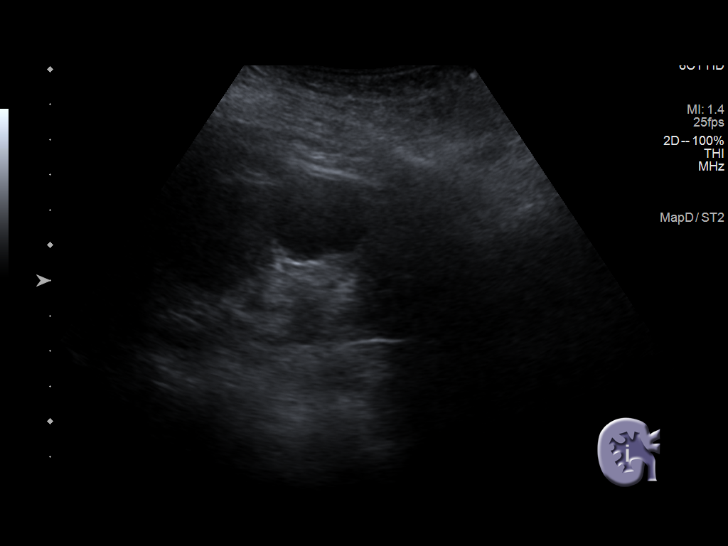
[im 18/54]
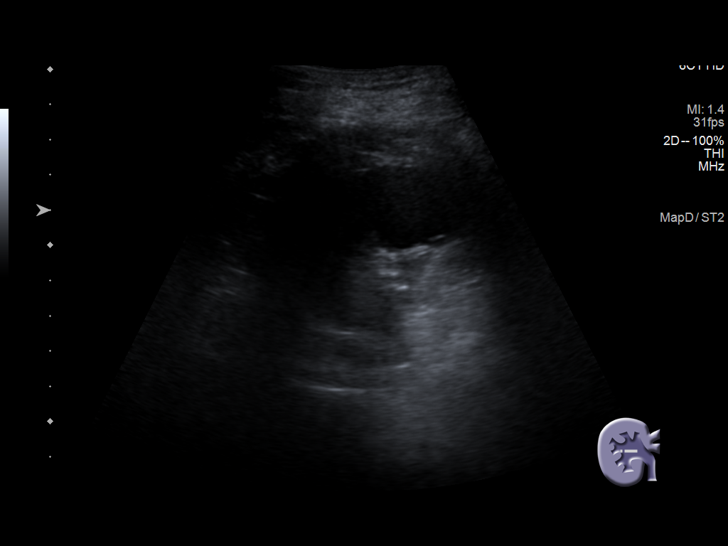
[im 20/54]
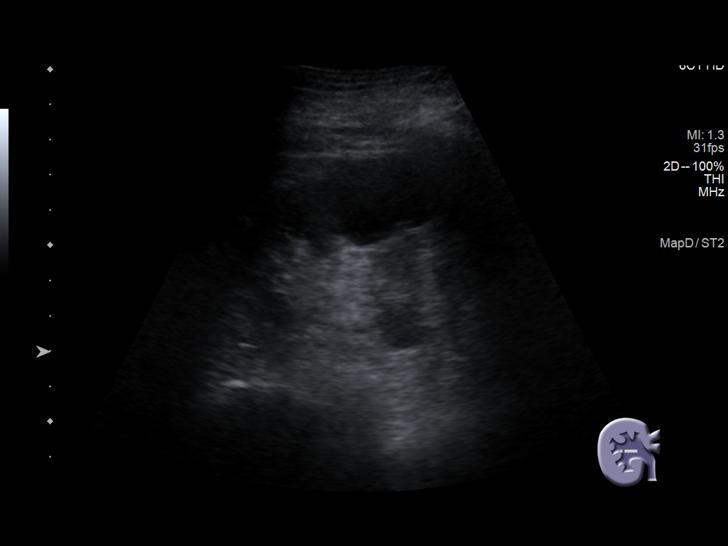
[im 25/54]
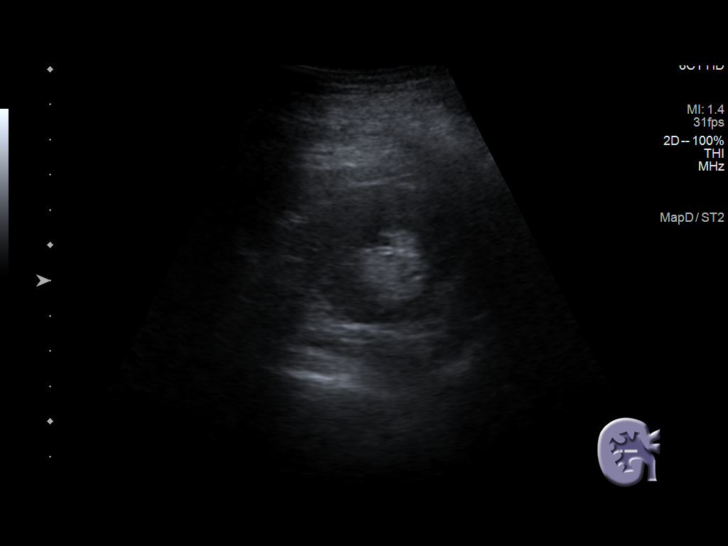
[im 29/54]
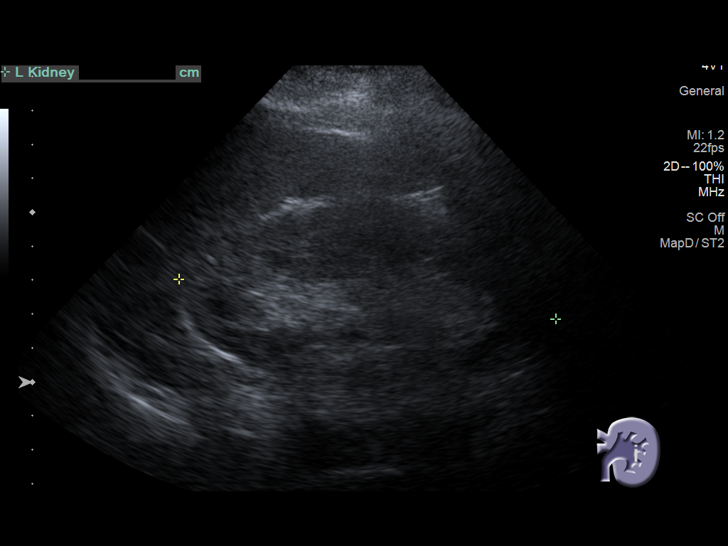
[im 34/54]
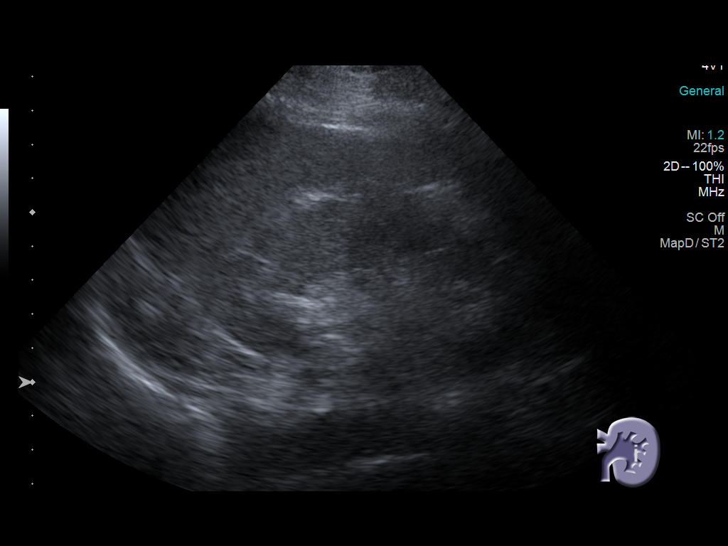
[im 36/54]
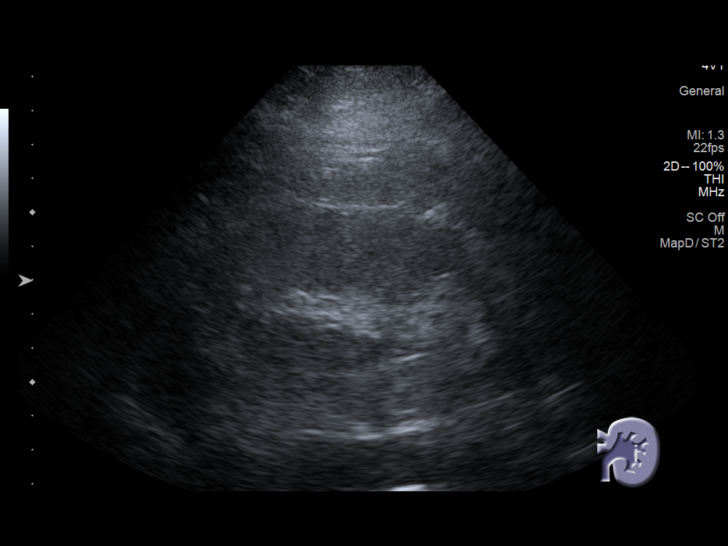
[im 40/54]
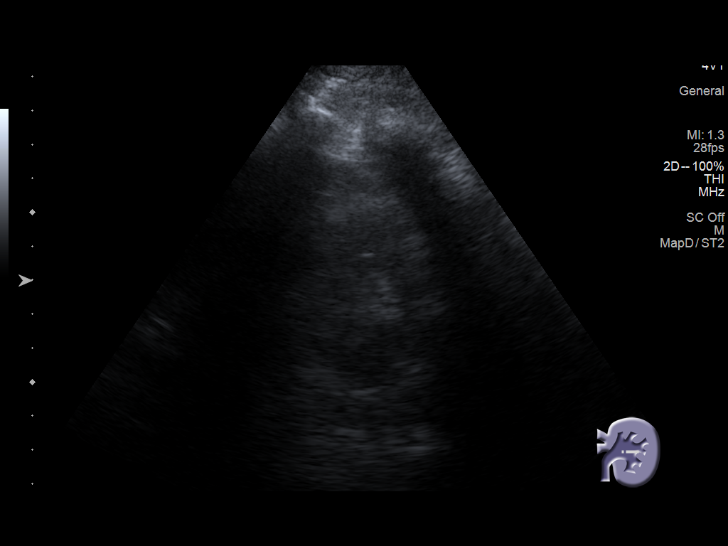
[im 45/54]
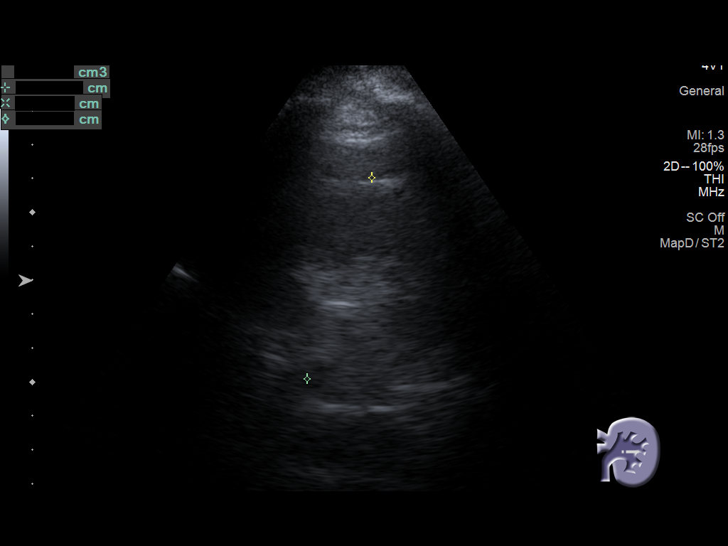
[im 49/54]
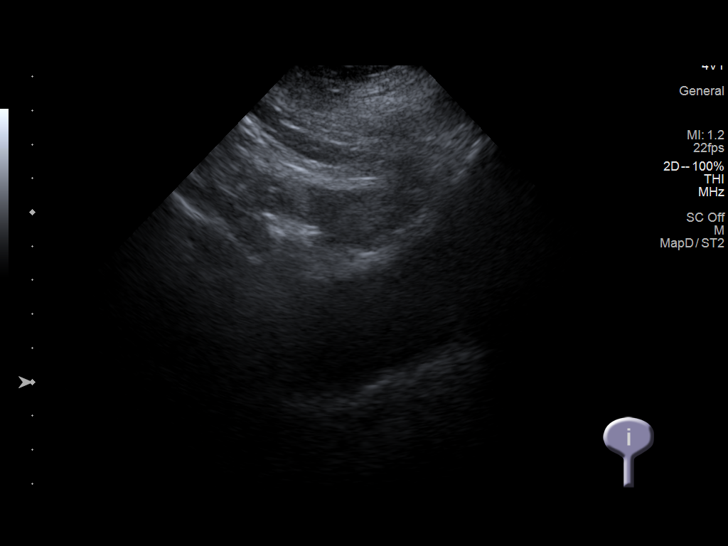
[im 54/54]
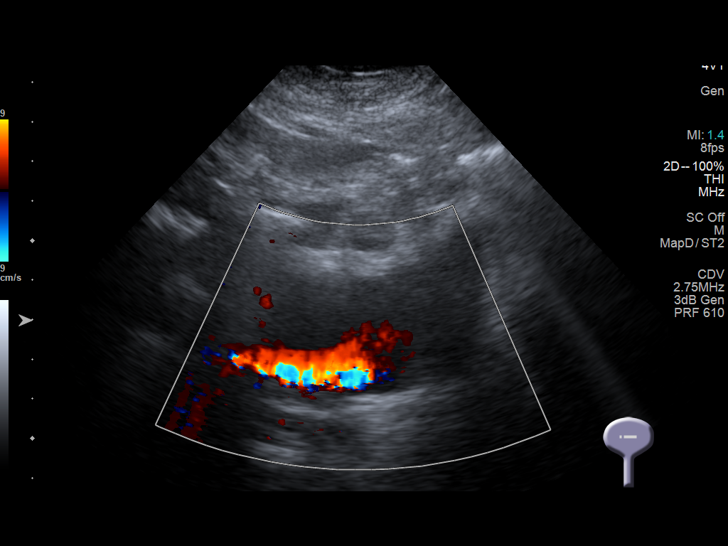

[14 of 25 positions shown; findings below may reference images not displayed]

FINDINGS: Right Kidney:

Renal measurements: 12.2 x 5.8 x 5.3 cm = volume: 194 mL.
Echogenicity within normal limits. No mass or hydronephrosis
visualized. There is slightly limited evaluation of the upper and
lower pole the right kidney due to overlying bowel gas. At least 2
simple cortical cysts are seen within the interpolar region of the
right kidney measuring up to 3.5 cm in greatest dimension.

Left Kidney:

Renal measurements: 11.3 x 5.6 x 6.2 cm = volume: 207 mL.
Echogenicity within normal limits. No mass or hydronephrosis
visualized.

Bladder:

Appears normal for degree of bladder distention. Bilateral ureteral
jets are identified

Other:

None.
IMPRESSION: Slightly limited but normal renal sonogram.
